# Patient Record
Sex: Female | Born: 1970 | Race: Black or African American | Hispanic: No | Smoking: Never smoker
Health system: Southern US, Community
[De-identification: ages and names within clinical notes are randomized; demographics above are authoritative.]

## PROBLEM LIST (undated history)

## (undated) DIAGNOSIS — B019 Varicella without complication: Secondary | ICD-10-CM

## (undated) DIAGNOSIS — G43909 Migraine, unspecified, not intractable, without status migrainosus: Secondary | ICD-10-CM

## (undated) DIAGNOSIS — R51 Headache: Secondary | ICD-10-CM

## (undated) DIAGNOSIS — IMO0002 Reserved for concepts with insufficient information to code with codable children: Secondary | ICD-10-CM

## (undated) DIAGNOSIS — K219 Gastro-esophageal reflux disease without esophagitis: Secondary | ICD-10-CM

## (undated) DIAGNOSIS — I10 Essential (primary) hypertension: Secondary | ICD-10-CM

## (undated) HISTORY — DX: Gastro-esophageal reflux disease without esophagitis: K21.9

## (undated) HISTORY — DX: Migraine, unspecified, not intractable, without status migrainosus: G43.909

## (undated) HISTORY — DX: Varicella without complication: B01.9

## (undated) HISTORY — DX: Reserved for concepts with insufficient information to code with codable children: IMO0002

## (undated) HISTORY — DX: Headache: R51

## (undated) HISTORY — DX: Essential (primary) hypertension: I10

---

## 1998-07-12 HISTORY — PX: WRIST SURGERY: SHX841

## 2005-05-06 ENCOUNTER — Emergency Department: Payer: Self-pay | Admitting: Internal Medicine

## 2005-05-08 ENCOUNTER — Emergency Department: Payer: Self-pay | Admitting: Emergency Medicine

## 2007-04-27 ENCOUNTER — Ambulatory Visit: Payer: Self-pay

## 2008-04-18 DIAGNOSIS — I10 Essential (primary) hypertension: Secondary | ICD-10-CM | POA: Insufficient documentation

## 2011-12-30 ENCOUNTER — Encounter: Payer: Self-pay | Admitting: Internal Medicine

## 2011-12-30 ENCOUNTER — Ambulatory Visit (INDEPENDENT_AMBULATORY_CARE_PROVIDER_SITE_OTHER): Payer: PRIVATE HEALTH INSURANCE | Admitting: Internal Medicine

## 2011-12-30 VITALS — BP 140/90 | HR 78 | Temp 98.9°F | Ht 68.0 in | Wt 180.0 lb

## 2011-12-30 DIAGNOSIS — M25562 Pain in left knee: Secondary | ICD-10-CM

## 2011-12-30 DIAGNOSIS — Z0000 Encounter for general adult medical examination without abnormal findings: Secondary | ICD-10-CM

## 2011-12-30 DIAGNOSIS — M25569 Pain in unspecified knee: Secondary | ICD-10-CM

## 2011-12-30 DIAGNOSIS — I10 Essential (primary) hypertension: Secondary | ICD-10-CM

## 2011-12-31 LAB — CBC WITH DIFFERENTIAL/PLATELET
Basophils Absolute: 0.1 10*3/uL (ref 0.0–0.1)
Eosinophils Absolute: 0 10*3/uL (ref 0.0–0.7)
Lymphocytes Relative: 26.9 % (ref 12.0–46.0)
MCHC: 32.9 g/dL (ref 30.0–36.0)
Neutrophils Relative %: 64 % (ref 43.0–77.0)
Platelets: 310 10*3/uL (ref 150.0–400.0)
RBC: 4.5 Mil/uL (ref 3.87–5.11)
RDW: 14.9 % — ABNORMAL HIGH (ref 11.5–14.6)

## 2011-12-31 LAB — COMPREHENSIVE METABOLIC PANEL
Albumin: 4.4 g/dL (ref 3.5–5.2)
Alkaline Phosphatase: 64 U/L (ref 39–117)
BUN: 12 mg/dL (ref 6–23)
Calcium: 9.4 mg/dL (ref 8.4–10.5)
GFR: 76.01 mL/min (ref >60.00)
Glucose, Bld: 77 mg/dL (ref 70–99)
Potassium: 3.3 mEq/L — ABNORMAL LOW (ref 3.5–5.1)

## 2011-12-31 LAB — LIPID PANEL
Cholesterol: 169 mg/dL (ref 0–200)
HDL: 55.1 mg/dL (ref >39.00)
Triglycerides: 65 mg/dL (ref 0.0–149.0)

## 2011-12-31 LAB — MICROALBUMIN / CREATININE URINE RATIO
Creatinine,U: 132.6 mg/dL
Microalb, Ur: 0.1 mg/dL (ref 0.0–1.9)

## 2011-12-31 NOTE — Assessment & Plan Note (Signed)
Blood pressure slightly elevated today. We'll continue to monitor. If blood pressure consistently greater than 140/90, will consider addition of medication. For now, continue Maxide. We'll check renal function with labs. Follow up 6 months.

## 2011-12-31 NOTE — Assessment & Plan Note (Signed)
Patient with left knee pain over the last couple weeks, now resolved. Exam is normal. We'll continue to monitor. If symptoms are recurrent, patient will call or e-mail.

## 2011-12-31 NOTE — Progress Notes (Signed)
Subjective:    Patient ID: Cindy Bishop, female    DOB: 06-24-1971, 41 y.o.   MRN: [id number]  HPI 41 year old female with history of hypertension presents to establish care. She generally reports she's been feeling well. Approximately 2 weeks ago, she developed some pain in her left knee. This is associated with swelling. There is no known trauma to her knee. The swelling and pain resolved without any intervention. She denies any weakness in her leg or instability in her knee. Aside from this, she is feeling well and has no complaints. In regards to her history of hypertension, she reports good compliance with her medication. She denies any headache, chest pain, palpitations.  Outpatient Encounter Prescriptions as of 12/30/2011  Medication Sig Dispense Refill  . triamterene-hydrochlorothiazide (MAXZIDE-25) 37.5-25 MG per tablet Take 1 tablet by mouth daily.        Review of Systems  Constitutional: Negative for fever, chills, appetite change, fatigue and unexpected weight change.  HENT: Negative for ear pain, congestion, sore throat, trouble swallowing, neck pain, voice change and sinus pressure.   Eyes: Negative for visual disturbance.  Respiratory: Negative for cough, shortness of breath, wheezing and stridor.   Cardiovascular: Negative for chest pain, palpitations and leg swelling.  Gastrointestinal: Negative for nausea, vomiting, abdominal pain, diarrhea, constipation, blood in stool, abdominal distention and anal bleeding.  Genitourinary: Negative for dysuria and flank pain.  Musculoskeletal: Negative for myalgias, arthralgias and gait problem.  Skin: Negative for color change and rash.  Neurological: Negative for dizziness and headaches.  Hematological: Negative for adenopathy. Does not bruise/bleed easily.  Psychiatric/Behavioral: Negative for suicidal ideas, disturbed wake/sleep cycle and dysphoric mood. The patient is not nervous/anxious.    BP 140/90  Pulse 78  Temp 98.9 F  (37.2 C) (Oral)  Ht 5\' 8"  (1.727 m)  Wt 180 lb (81.647 kg)  BMI 27.37 kg/m2  SpO2 98%  LMP 12/11/2011     Objective:   Physical Exam  Constitutional: She is oriented to person, place, and time. She appears well-developed and well-nourished. No distress.  HENT:  Head: Normocephalic and atraumatic.  Right Ear: External ear normal.  Left Ear: External ear normal.  Nose: Nose normal.  Mouth/Throat: Oropharynx is clear and moist. No oropharyngeal exudate.  Eyes: Conjunctivae are normal. Pupils are equal, round, and reactive to light. Right eye exhibits no discharge. Left eye exhibits no discharge. No scleral icterus.  Neck: Normal range of motion. Neck supple. No tracheal deviation present. No thyromegaly present.  Cardiovascular: Normal rate, regular rhythm, normal heart sounds and intact distal pulses.  Exam reveals no gallop and no friction rub.   No murmur heard. Pulmonary/Chest: Effort normal and breath sounds normal. No respiratory distress. She has no wheezes. She has no rales. She exhibits no tenderness.  Abdominal: Soft. Bowel sounds are normal. She exhibits no distension. There is no tenderness.  Musculoskeletal: Normal range of motion. She exhibits no edema and no tenderness.       Left knee: She exhibits normal range of motion, no swelling, no effusion, no deformity, normal alignment, no LCL laxity and normal patellar mobility.  Lymphadenopathy:    She has no cervical adenopathy.  Neurological: She is alert and oriented to person, place, and time. No cranial nerve deficit. She exhibits normal muscle tone. Coordination normal.  Skin: Skin is warm and dry. No rash noted. She is not diaphoretic. No erythema. No pallor.  Psychiatric: She has a normal mood and affect. Her behavior is normal. Judgment and thought content  normal.          Assessment & Plan:

## 2012-07-03 ENCOUNTER — Ambulatory Visit: Payer: PRIVATE HEALTH INSURANCE | Admitting: Internal Medicine

## 2012-08-26 ENCOUNTER — Other Ambulatory Visit: Payer: Self-pay

## 2013-05-17 ENCOUNTER — Other Ambulatory Visit: Payer: Self-pay

## 2015-10-17 ENCOUNTER — Other Ambulatory Visit: Payer: Self-pay | Admitting: Family Medicine

## 2015-10-23 ENCOUNTER — Other Ambulatory Visit: Payer: Self-pay | Admitting: Family Medicine

## 2016-03-29 ENCOUNTER — Other Ambulatory Visit: Payer: Self-pay | Admitting: Family Medicine

## 2016-03-29 DIAGNOSIS — Z1231 Encounter for screening mammogram for malignant neoplasm of breast: Secondary | ICD-10-CM

## 2016-03-30 ENCOUNTER — Ambulatory Visit
Admission: RE | Admit: 2016-03-30 | Discharge: 2016-03-30 | Disposition: A | Discharge status: Home / Self Care | Payer: PRIVATE HEALTH INSURANCE | Source: Ambulatory Visit | Attending: Family Medicine | Admitting: Family Medicine

## 2016-03-30 DIAGNOSIS — Z1231 Encounter for screening mammogram for malignant neoplasm of breast: Secondary | ICD-10-CM | POA: Insufficient documentation

## 2016-04-07 ENCOUNTER — Ambulatory Visit (INDEPENDENT_AMBULATORY_CARE_PROVIDER_SITE_OTHER): Payer: No Typology Code available for payment source | Admitting: Internal Medicine

## 2016-04-07 ENCOUNTER — Ambulatory Visit
Admission: RE | Admit: 2016-04-07 | Discharge: 2016-04-07 | Disposition: A | Discharge status: Home / Self Care | Payer: PRIVATE HEALTH INSURANCE | Source: Ambulatory Visit | Attending: Internal Medicine | Admitting: Internal Medicine

## 2016-04-07 ENCOUNTER — Encounter: Payer: Self-pay | Admitting: Internal Medicine

## 2016-04-07 VITALS — BP 122/82 | HR 62 | Temp 98.3°F | Resp 16 | Ht 67.0 in | Wt 193.0 lb

## 2016-04-07 DIAGNOSIS — N943 Premenstrual tension syndrome: Secondary | ICD-10-CM

## 2016-04-07 DIAGNOSIS — I10 Essential (primary) hypertension: Secondary | ICD-10-CM | POA: Diagnosis not present

## 2016-04-07 DIAGNOSIS — M5134 Other intervertebral disc degeneration, thoracic region: Secondary | ICD-10-CM | POA: Insufficient documentation

## 2016-04-07 DIAGNOSIS — M26623 Arthralgia of bilateral temporomandibular joint: Secondary | ICD-10-CM | POA: Insufficient documentation

## 2016-04-07 DIAGNOSIS — M4184 Other forms of scoliosis, thoracic region: Secondary | ICD-10-CM | POA: Insufficient documentation

## 2016-04-07 DIAGNOSIS — K648 Other hemorrhoids: Secondary | ICD-10-CM

## 2016-04-07 DIAGNOSIS — M549 Dorsalgia, unspecified: Secondary | ICD-10-CM

## 2016-04-07 DIAGNOSIS — G43829 Menstrual migraine, not intractable, without status migrainosus: Secondary | ICD-10-CM | POA: Diagnosis not present

## 2016-04-07 DIAGNOSIS — Z872 Personal history of diseases of the skin and subcutaneous tissue: Secondary | ICD-10-CM | POA: Diagnosis not present

## 2016-04-07 DIAGNOSIS — K644 Residual hemorrhoidal skin tags: Secondary | ICD-10-CM | POA: Insufficient documentation

## 2016-04-07 DIAGNOSIS — Z8739 Personal history of other diseases of the musculoskeletal system and connective tissue: Secondary | ICD-10-CM | POA: Insufficient documentation

## 2016-04-07 DIAGNOSIS — M62830 Muscle spasm of back: Secondary | ICD-10-CM | POA: Insufficient documentation

## 2016-04-07 DIAGNOSIS — M5442 Lumbago with sciatica, left side: Secondary | ICD-10-CM | POA: Diagnosis present

## 2016-04-07 DIAGNOSIS — M5136 Other intervertebral disc degeneration, lumbar region: Secondary | ICD-10-CM | POA: Insufficient documentation

## 2016-04-07 MED ORDER — MELOXICAM 15 MG PO TABS: 15.0000 mg | ORAL_TABLET | Freq: Every day | ORAL | 3 refills | Status: DC

## 2016-04-07 MED ORDER — METHOCARBAMOL 500 MG PO TABS: 500.0000 mg | ORAL_TABLET | Freq: Four times a day (QID) | ORAL | 0 refills | Status: DC

## 2016-04-07 NOTE — Progress Notes (Signed)
Date:  04/07/2016   Name:  Cindy Bishop   DOB:  23-Dec-1970   MRN:  [id number]   Chief Complaint: Establish Care and Back Pain (1 week of pain in left middle of back. Also has some pain in Left arm and leg and often in jaw. ) Back Pain  This is a recurrent problem. The current episode started more than 1 year ago. The problem has been gradually worsening since onset. The pain is present in the thoracic spine and lumbar spine. The quality of the pain is described as aching and cramping. The pain radiates to the left thigh. The pain is mild. The pain is worse during the day (especially when standing at work). Associated symptoms include headaches, numbness and tingling. Pertinent negatives include no abdominal pain, chest pain, dysuria, fever or weakness. Treatments tried: seen years ago at [facility] showed degenerative changes.  Hypertension  This is a chronic problem. The current episode started more than 1 year ago. The problem is unchanged. The problem is controlled. Associated symptoms include headaches. Pertinent negatives include no chest pain, palpitations or shortness of breath. Past treatments include ACE inhibitors and diuretics. The current treatment provides significant improvement. There are no compliance problems.   Headache   This is a recurrent problem. The problem occurs monthly (just prior to menstrual cycle). The problem has been unchanged. Associated symptoms include back pain, numbness and tingling. Pertinent negatives include no abdominal pain, coughing, dizziness, fever, sinus pressure or weakness. Her past medical history is significant for hypertension.  Rectal Bleeding   The current episode started more than 1 week ago. Onset quality: intermittently for several years - about every 2-3 months. The problem occurs occasionally. The patient is experiencing no pain. The stool is described as bloody. Associated symptoms include hemorrhoids and headaches. Pertinent negatives  include no fever, no abdominal pain, no diarrhea, no rectal pain, no chest pain and no coughing.  Jaw Pain - increasing over the past few weeks.  Tingling at times.  No pain with eating.  No swelling or trouble swallowing. Left wrist - tingling in left hand and forearm at times. No weakness.  She is left handed.  It bothers her the most in the evening when she is trying to go to sleep.  She has hx ganglion cyst removal on the right.    Review of Systems  Constitutional: Negative for chills, fatigue and fever.  HENT: Negative for sinus pressure and trouble swallowing.   Eyes: Negative for visual disturbance.  Respiratory: Negative for cough, chest tightness and shortness of breath.   Cardiovascular: Negative for chest pain, palpitations and leg swelling.  Gastrointestinal: Positive for anal bleeding, hematochezia and hemorrhoids. Negative for abdominal pain, constipation, diarrhea and rectal pain.  Genitourinary: Negative for dysuria and menstrual problem.  Musculoskeletal: Positive for back pain.  Neurological: Positive for tingling, numbness and headaches. Negative for dizziness, tremors, syncope and weakness.  Hematological: Negative for adenopathy.    Patient Active Problem List   Diagnosis Date Noted  . Hypertension 12/30/2011  . Left knee pain 12/30/2011  . Leg varices 03/06/2009  . Essential (primary) hypertension 04/18/2008    Prior to Admission medications   Medication Sig Start Date End Date Taking? Authorizing Provider  lisinopril-hydrochlorothiazide (PRINZIDE,ZESTORETIC) 10-12.5 MG tablet TAKE ONE TABLET BY MOUTH ONCE DAILY 10/17/15  Yes Jodell Cipro Chrismon, PA    No Known Allergies  Past Surgical History:  Procedure Laterality Date  . WRIST SURGERY  2000   [facility]    Social History  Substance Use Topics  . Smoking status: Never Smoker  . Smokeless tobacco: Never Used  . Alcohol use No     Medication list has been reviewed and updated.   Physical  Exam  Constitutional: She is oriented to person, place, and time. She appears well-developed. No distress.  HENT:  Head: Normocephalic and atraumatic.  Neck: Normal range of motion. Neck supple. No thyromegaly present.  Cardiovascular: Normal rate, regular rhythm and normal heart sounds.   Pulmonary/Chest: She is in respiratory distress.  Musculoskeletal: Normal range of motion.       Left wrist: Normal.       Cervical back: Normal.       Thoracic back: She exhibits tenderness and spasm.       Lumbar back: She exhibits bony tenderness. She exhibits no spasm.  Tinel's and PHalen's signs negative bilaterally   SLR negative bilaterally  Jaw opening with mild lateral excursion to right; no click, pain or swelling  Lymphadenopathy:    She has no cervical adenopathy.    She has no axillary adenopathy.  Neurological: She is alert and oriented to person, place, and time. She has normal strength and normal reflexes. No cranial nerve deficit or sensory deficit. Gait normal.  Skin: Skin is warm and dry. No rash noted.  Psychiatric: She has a normal mood and affect. Her speech is normal and behavior is normal. Thought content normal.  Nursing note and vitals reviewed.   BP 122/82   Pulse 62   Temp 98.3 F (36.8 C) (Oral)   Resp 16   Ht 5\' 7"  (1.702 m)   Wt 193 lb (87.5 kg)   LMP 03/29/2016   SpO2 99%   BMI 30.23 kg/m   Assessment and Plan: 1. Essential hypertension controlled  2. Midline low back pain with left-sided sciatica Begin mobic - DG Lumbar Spine Complete; Future - meloxicam (MOBIC) 15 MG tablet; Take 1 tablet (15 mg total) by mouth daily.  Dispense: 30 tablet; Refill: 3  3. Spasm of muscle, back Begin muscle relaxant and heat daily - methocarbamol (ROBAXIN) 500 MG tablet; Take 1 tablet (500 mg total) by mouth 4 (four) times daily.  Dispense: 120 tablet; Refill: 0  4. Mid back pain - DG Thoracic Spine W/Swimmers; Future  5. Bilateral temporomandibular joint  pain Avoid hard, chewy and sticky foods  6. History of ganglion cyst May be early cyst on left causing sx - monitor for worsening  7. Menstrual migraine without status migrainosus, not intractable Unchanged - may need to discuss further at next visit  8. Bleeding external hemorrhoids Continue Prep H PRN Follow up if persistent   Bari EdwardLaura Alexie Lanni, MD [facility]  04/07/2016

## 2016-04-07 NOTE — Patient Instructions (Signed)

## 2016-08-05 ENCOUNTER — Other Ambulatory Visit: Payer: Self-pay | Admitting: Family Medicine

## 2016-08-05 DIAGNOSIS — J069 Acute upper respiratory infection, unspecified: Secondary | ICD-10-CM

## 2016-08-05 MED ORDER — PROMETHAZINE-DM 6.25-15 MG/5ML PO SYRP: 5.0000 mL | ORAL_SOLUTION | Freq: Four times a day (QID) | ORAL | 0 refills | Status: DC | PRN

## 2016-08-12 ENCOUNTER — Encounter: Payer: Self-pay | Admitting: Family Medicine

## 2016-08-12 DIAGNOSIS — D509 Iron deficiency anemia, unspecified: Secondary | ICD-10-CM | POA: Insufficient documentation

## 2016-11-03 ENCOUNTER — Other Ambulatory Visit: Payer: Self-pay | Admitting: Family Medicine

## 2016-11-25 ENCOUNTER — Other Ambulatory Visit: Payer: Self-pay | Admitting: Family Medicine

## 2016-12-03 ENCOUNTER — Ambulatory Visit (INDEPENDENT_AMBULATORY_CARE_PROVIDER_SITE_OTHER): Payer: No Typology Code available for payment source | Admitting: Family Medicine

## 2016-12-03 ENCOUNTER — Encounter: Payer: Self-pay | Admitting: Family Medicine

## 2016-12-03 VITALS — BP 122/72 | HR 70 | Temp 98.1°F | Wt 202.8 lb

## 2016-12-03 DIAGNOSIS — I10 Essential (primary) hypertension: Secondary | ICD-10-CM | POA: Diagnosis not present

## 2016-12-03 DIAGNOSIS — K625 Hemorrhage of anus and rectum: Secondary | ICD-10-CM

## 2016-12-03 MED ORDER — HYDROCORTISONE ACETATE 25 MG RE SUPP: 25.0000 mg | Freq: Two times a day (BID) | RECTAL | 0 refills | Status: DC

## 2016-12-03 NOTE — Progress Notes (Signed)
Patient: Cindy SeveranceDarlene Diaz-Bandera Female    DOB: [birth year]   46 y.o.   MRN: [id number] Visit Date: 12/03/2016  Today's Provider: Dortha Kernennis Chrismon, PA   Chief Complaint  Patient presents with  . Rectal Bleeding   Subjective:    Rectal Bleeding   Episode onset: 2 weeks ago. The problem occurs occasionally. The patient is experiencing no pain. The stool is described as mixed with blood. There was no prior successful therapy. There was no prior unsuccessful therapy. Associated symptoms include diarrhea. Urine output has been normal. Her past medical history is significant for a recent illness (cold).    Patient Active Problem List   Diagnosis Date Noted  . Iron deficiency anemia 08/12/2016  . Midline low back pain with left-sided sciatica 04/07/2016  . Spasm of muscle, back 04/07/2016  . Mid back pain 04/07/2016  . Bilateral temporomandibular joint pain 04/07/2016  . History of ganglion cyst 04/07/2016  . Menstrual migraine without status migrainosus, not intractable 04/07/2016  . Bleeding external hemorrhoids 04/07/2016  . Essential hypertension 12/30/2011  . Left knee pain 12/30/2011  . Leg varices 03/06/2009   Past Surgical History:  Procedure Laterality Date  . WRIST SURGERY  2000   [facility]   Family History  Problem Relation Age of Onset  . Diabetes Mother   . Hypertension Mother   . Hypertension Brother   . Heart disease Maternal Grandmother 1969  . Healthy Father   . Breast cancer Neg Hx    No Known Allergies   Previous Medications   LISINOPRIL-HYDROCHLOROTHIAZIDE (PRINZIDE,ZESTORETIC) 10-12.5 MG TABLET    TAKE ONE TABLET BY MOUTH ONCE DAILY    Review of Systems  Constitutional: Negative.   Respiratory: Negative.   Cardiovascular: Negative.   Gastrointestinal: Positive for diarrhea and hematochezia.    Social History  Substance Use Topics  . Smoking status: Never Smoker  . Smokeless tobacco: Never Used  . Alcohol use No   Objective:   BP 122/72  (BP Location: Right Arm, Patient Position: Sitting, Cuff Size: Normal)   Pulse 70   Temp 98.1 F (36.7 C) (Oral)   Wt 202 lb 12.8 oz (92 kg)   SpO2 98%   BMI 31.76 kg/m   Physical Exam  Constitutional: She is oriented to person, place, and time. She appears well-developed and well-nourished. No distress.  HENT:  Head: Normocephalic and atraumatic.  Right Ear: Hearing normal.  Left Ear: Hearing normal.  Nose: Nose normal.  Eyes: Conjunctivae and lids are normal. Right eye exhibits no discharge. Left eye exhibits no discharge. No scleral icterus.  Cardiovascular: Normal rate and regular rhythm.   Pulmonary/Chest: Effort normal and breath sounds normal. No respiratory distress.  Genitourinary:  Genitourinary Comments: Under anoscopy red irritation to internal hemorrhoids seen. No active bleeding at the present. No pain or sign of fissure.   Musculoskeletal: Normal range of motion.  Neurological: She is alert and oriented to person, place, and time.  Skin: Skin is intact. No lesion and no rash noted.  Psychiatric: She has a normal mood and affect. Her speech is normal and behavior is normal. Thought content normal.      Assessment & Plan:     1. Bright red rectal bleeding Intermittent bright red blood from rectum over the past 2 weeks. No pain, diarrhea or constipation. History of iron deficiency anemia. Under anoscope exam, some internal hemorrhoid irritation seen. Patient very worried about possible colon cancer and requests referral for colonoscopy. Will treat with Anusol HC  suppositories BID. - Ambulatory referral to Gastroenterology  2. Essential hypertension Well controlled on Zestoretic 10/12.5 mg qd. No side effects of dizziness, fatigue, cramps, palpitations, angioedema or dyspnea. Continue present regimen and recheck at [facility] regularly. Will get labs there in October.

## 2017-01-18 ENCOUNTER — Encounter: Payer: Self-pay | Admitting: Gastroenterology

## 2017-01-18 ENCOUNTER — Ambulatory Visit (INDEPENDENT_AMBULATORY_CARE_PROVIDER_SITE_OTHER): Payer: No Typology Code available for payment source | Admitting: Gastroenterology

## 2017-01-18 ENCOUNTER — Other Ambulatory Visit
Admission: RE | Admit: 2017-01-18 | Discharge: 2017-01-18 | Disposition: A | Discharge status: Home / Self Care | Payer: PRIVATE HEALTH INSURANCE | Source: Ambulatory Visit | Attending: Gastroenterology | Admitting: Gastroenterology

## 2017-01-18 ENCOUNTER — Other Ambulatory Visit: Payer: Self-pay

## 2017-01-18 ENCOUNTER — Telehealth: Payer: Self-pay

## 2017-01-18 VITALS — BP 124/72 | HR 64 | Temp 98.4°F | Ht 67.0 in | Wt 203.2 lb

## 2017-01-18 DIAGNOSIS — K625 Hemorrhage of anus and rectum: Secondary | ICD-10-CM | POA: Diagnosis not present

## 2017-01-18 DIAGNOSIS — Z800 Family history of malignant neoplasm of digestive organs: Secondary | ICD-10-CM

## 2017-01-18 LAB — CBC WITH DIFFERENTIAL/PLATELET
BASOS ABS: 0 10*3/uL (ref 0–0.1)
Basophils Relative: 0 %
Eosinophils Absolute: 0 10*3/uL (ref 0–0.7)
Eosinophils Relative: 0 %
HEMATOCRIT: 37.2 % (ref 35.0–47.0)
Hemoglobin: 12.3 g/dL (ref 12.0–16.0)
LYMPHS PCT: 27 %
Lymphs Abs: 1.6 10*3/uL (ref 1.0–3.6)
MCH: 29.3 pg (ref 26.0–34.0)
MCHC: 32.9 g/dL (ref 32.0–36.0)
MCV: 88.9 fL (ref 80.0–100.0)
MONO ABS: 0.5 10*3/uL (ref 0.2–0.9)
MONOS PCT: 9 %
NEUTROS ABS: 3.8 10*3/uL (ref 1.4–6.5)
Neutrophils Relative %: 64 %
Platelets: 252 10*3/uL (ref 150–440)
RBC: 4.19 MIL/uL (ref 3.80–5.20)
RDW: 14.2 % (ref 11.5–14.5)
WBC: 6 10*3/uL (ref 3.6–11.0)

## 2017-01-18 MED ORDER — NA SULFATE-K SULFATE-MG SULF 17.5-3.13-1.6 GM/177ML PO SOLN: 1.0000 | Freq: Once | ORAL | 0 refills | Status: AC

## 2017-01-18 NOTE — Progress Notes (Signed)
Cindy MoodKiran Arohi Salvatierra MD, MRCP(U.K) [address]  [address]  [place], [place] [address]  Main: [phone]  Fax: [phone]   Gastroenterology Consultation  Referring Provider:     [name], Cindy E, PA Primary Care Physician:  Cindy Bishop, Cindy H, MD Primary Gastroenterologist:  Dr. Wyline MoodKiran Sua Bishop  Reason for Consultation:     Rectal bleeding         HPI:   Cindy Bishop is a 46 y.o. y/o female referred for consultation & management  by Dr. Judithann Bishop, Cindy CowdenLaura H, MD.     Rectal bleeding :  Onset and where was blood seen  :on and off for 3 years, 11 days back was more intense and lasted for 11 days, sees blood mixed in the stools. No pain with  abowel movement  Frequency of bowel movements :4-5 times a week  Consistency : not hard  Change in shape of stool:no  Pain associated with bowel movements:no  Blood thinner usage:no  NSAID's: no  Prior colonoscopy :no  Family history of colon cancer or polyps:no  Weight loss:no     Past Medical History:  Diagnosis Date  . Chicken pox   . Degenerative disc disease   . GERD (gastroesophageal reflux disease)   . Headache(784.0)   . Hypertension   . Migraine     Past Surgical History:  Procedure Laterality Date  . WRIST SURGERY  2000   [facility]    Prior to Admission medications   Medication Sig Start Date End Date Taking? Authorizing Provider  lisinopril-hydrochlorothiazide (PRINZIDE,ZESTORETIC) 10-12.5 MG tablet TAKE ONE TABLET BY MOUTH ONCE DAILY 11/04/16  Yes Chrismon, Jodell Ciproennis E, PA  hydrocortisone (ANUSOL-HC) 25 MG suppository Place 1 suppository (25 mg total) rectally 2 (two) times daily. Patient not taking: Reported on 01/18/2017 12/03/16   Chrismon, Jodell Ciproennis E, PA    Family History  Problem Relation Age of Onset  . Diabetes Mother   . Hypertension Mother   . Hypertension Brother   . Heart disease Maternal Grandmother [id number]  . Healthy Father   . Breast cancer Neg Hx      Social History  Substance Use  Topics  . Smoking status: Never Smoker  . Smokeless tobacco: Never Used  . Alcohol use No    Allergies as of 01/18/2017  . (No Known Allergies)    Review of Systems:    All systems reviewed and negative except where noted in HPI.   Physical Exam:  BP 124/72   Pulse 64   Temp 98.4 F (36.9 C) (Oral)   Ht 5\' 7"  (1.702 m)   Wt 203 lb 3.2 oz (92.2 kg)   BMI 31.83 kg/m  No LMP recorded. Psych:  Alert and cooperative. Normal mood and affect. General:   Alert,  Well-developed, well-nourished, pleasant and cooperative in NAD Head:  Normocephalic and atraumatic. Eyes:  Sclera clear, no icterus.   Conjunctiva pink. Ears:  Normal auditory acuity. Nose:  No deformity, discharge, or lesions. Mouth:  No deformity or lesions,oropharynx pink & moist. Neck:  Supple; no masses or thyromegaly. Lungs:  Respirations even and unlabored.  Clear throughout to auscultation.   No wheezes, crackles, or rhonchi. No acute distress. Heart:  Regular rate and rhythm; no murmurs, clicks, rubs, or gallops. Abdomen:  Normal bowel sounds.  No bruits.  Soft, non-tender and non-distended without masses, hepatosplenomegaly or hernias noted.  No guarding or rebound tenderness.    Msk:  Symmetrical without gross deformities. Good, equal movement & strength bilaterally. Pulses:  Normal pulses noted. Extremities:  No clubbing or edema.  No cyanosis. Neurologic:  Alert and oriented x3;  grossly normal neurologically. Skin:  Intact without significant lesions or rashes. No jaundice. Lymph Nodes:  No significant cervical adenopathy. Psych:  Alert and cooperative. Normal mood and affect.  Imaging Studies: No results found.  Assessment and Plan:   Cindy Bishop is a 46 y.o. y/o female has been referred for rectal bleeding . Painless. Will proceed with a diagnostic colonoscopy and obtaina CBC  I have discussed alternative options, risks & benefits,  which include, but are not limited to, bleeding, infection,  perforation,respiratory complication & drug reaction.  The patient agrees with this plan & written consent will be obtained.     Follow up PRN  Dr Cindy Mood MD,MRCP(U.K)

## 2017-01-18 NOTE — Telephone Encounter (Signed)
Gastroenterology Pre-Procedure Review  Request Date: 7/31 Requesting Physician: Dr. Tobi BastosAnna  PATIENT REVIEW QUESTIONS: The patient responded to the following health history questions as indicated:    1. Are you having any GI issues? no 2. Do you have a personal history of Polyps? no 3. Do you have a family history of Colon Cancer or Polyps? no 4. Diabetes Mellitus? no 5. Joint replacements in the past 12 months?no 6. Major health problems in the past 3 months?no 7. Any artificial heart valves, MVP, or defibrillator?no    MEDICATIONS & ALLERGIES:    Patient reports the following regarding taking any anticoagulation/antiplatelet therapy:   Plavix, Coumadin, Eliquis, Xarelto, Lovenox, Pradaxa, Brilinta, or Effient? no Aspirin? no  Patient confirms/reports the following medications:  Current Outpatient Prescriptions  Medication Sig Dispense Refill  . hydrocortisone (ANUSOL-HC) 25 MG suppository Place 1 suppository (25 mg total) rectally 2 (two) times daily. (Patient not taking: Reported on 01/18/2017) 12 suppository 0  . lisinopril-hydrochlorothiazide (PRINZIDE,ZESTORETIC) 10-12.5 MG tablet TAKE ONE TABLET BY MOUTH ONCE DAILY 30 tablet 11   No current facility-administered medications for this visit.     Patient confirms/reports the following allergies:  No Known Allergies  No orders of the defined types were placed in this encounter.   AUTHORIZATION INFORMATION Primary Insurance: 1D#: Group #:  Secondary Insurance: 1D#: Group #:  SCHEDULE INFORMATION: Date: 7/31 Time: Location: [facility]

## 2017-01-22 ENCOUNTER — Emergency Department
Admission: EM | Admit: 2017-01-22 | Discharge: 2017-01-22 | Disposition: A | Discharge status: Home / Self Care | Payer: No Typology Code available for payment source | Attending: Emergency Medicine | Admitting: Emergency Medicine

## 2017-01-22 ENCOUNTER — Encounter: Payer: Self-pay | Admitting: Emergency Medicine

## 2017-01-22 DIAGNOSIS — M545 Low back pain: Secondary | ICD-10-CM | POA: Diagnosis present

## 2017-01-22 DIAGNOSIS — Z79899 Other long term (current) drug therapy: Secondary | ICD-10-CM | POA: Insufficient documentation

## 2017-01-22 DIAGNOSIS — M5442 Lumbago with sciatica, left side: Secondary | ICD-10-CM | POA: Diagnosis not present

## 2017-01-22 DIAGNOSIS — I10 Essential (primary) hypertension: Secondary | ICD-10-CM | POA: Insufficient documentation

## 2017-01-22 MED ORDER — ORPHENADRINE CITRATE 30 MG/ML IJ SOLN
60.0000 mg | Freq: Two times a day (BID) | INTRAMUSCULAR | Status: DC
  Administered 2017-01-22: 60 mg via INTRAMUSCULAR
  Filled 2017-01-22: qty 2

## 2017-01-22 MED ORDER — PREDNISONE 50 MG PO TABS: ORAL_TABLET | ORAL | 0 refills | Status: DC

## 2017-01-22 MED ORDER — METHYLPREDNISOLONE SODIUM SUCC 125 MG IJ SOLR
125.0000 mg | Freq: Once | INTRAMUSCULAR | Status: AC
  Administered 2017-01-22: 125 mg via INTRAMUSCULAR
  Filled 2017-01-22: qty 2

## 2017-01-22 NOTE — ED Triage Notes (Signed)
Pt c/o left lower back pain X 1 month.  Got worse yesterday and now radiates into left leg.  Pain worse with certain movements. No loss bowel or bladder. Ambulatory to triage without difficulty.

## 2017-01-22 NOTE — ED Notes (Signed)
See triage note  States she developed lower back pain about 1 month ago  States pain is now radiating into left leg  No injury or bowel or bladder problems

## 2017-01-22 NOTE — ED Provider Notes (Signed)
[facility] Emergency Department Provider Note  ____________________________________________  Time seen: Approximately 11:47 PM  I have reviewed the triage vital signs and the nursing notes.   HISTORY  Chief Complaint Back Pain    HPI Cindy Bishop is a 46 y.o. female presenting to the emergency department with 10 out of 10 bilateral low back pain with radiculopathy of the left leg. Patient states that she has experienced similar symptoms in the past. She denies weakness, changes in sensation of the lower extremities or bowel or bladder incontinence. She denies trauma or heavy lifting. No falls. No alleviating measures have been attempted.   Past Medical History:  Diagnosis Date  . Chicken pox   . Degenerative disc disease   . GERD (gastroesophageal reflux disease)   . Headache(784.0)   . Hypertension   . Migraine     Patient Active Problem List   Diagnosis Date Noted  . Iron deficiency anemia 08/12/2016  . Midline low back pain with left-sided sciatica 04/07/2016  . Spasm of muscle, back 04/07/2016  . Mid back pain 04/07/2016  . Bilateral temporomandibular joint pain 04/07/2016  . History of ganglion cyst 04/07/2016  . Menstrual migraine without status migrainosus, not intractable 04/07/2016  . Bleeding external hemorrhoids 04/07/2016  . Essential hypertension 12/30/2011  . Left knee pain 12/30/2011  . Leg varices 03/06/2009    Past Surgical History:  Procedure Laterality Date  . WRIST SURGERY  2000   [facility] wrist, [facility]    Prior to Admission medications   Medication Sig Start Date End Date Taking? Authorizing Provider  hydrocortisone (ANUSOL-HC) 25 MG suppository Place 1 suppository (25 mg total) rectally 2 (two) times daily. Patient not taking: Reported on 01/18/2017 12/03/16   Chrismon, Jodell Ciproennis E, PA  lisinopril-hydrochlorothiazide (PRINZIDE,ZESTORETIC) 10-12.5 MG tablet TAKE ONE TABLET BY MOUTH ONCE DAILY 11/04/16   Chrismon,  Jodell Ciproennis E, PA  predniSONE (DELTASONE) 50 MG tablet Take one tablet by mouth daily for the next five days. 01/22/17   Orvil FeilWoods, Jaclyn M, PA-C    Allergies Patient has no known allergies.  Family History  Problem Relation Age of Onset  . Diabetes Mother   . Hypertension Mother   . Hypertension Brother   . Heart disease Maternal Grandmother 7769  . Healthy Father   . Lung cancer Father   . Breast cancer Cousin     Social History Social History  Substance Use Topics  . Smoking status: Never Smoker  . Smokeless tobacco: Never Used  . Alcohol use No    Review of Systems  Constitutional: No fever/chills Eyes: No visual changes. No discharge ENT: No upper respiratory complaints. Cardiovascular: no chest pain. Respiratory: no cough. No SOB. Gastrointestinal: No abdominal pain.  No nausea, no vomiting.  No diarrhea.  No constipation. Genitourinary: Negative for dysuria. No hematuria Musculoskeletal: Patient has low back pain with left lower extremity radiculopathy. Skin: Negative for rash, abrasions, lacerations, ecchymosis. Neurological: Negative for headaches, focal weakness or numbness. ____________________________________________   PHYSICAL EXAM:  VITAL SIGNS: ED Triage Vitals [01/22/17 1543]  Enc Vitals Group     BP (!) 155/79     Pulse Rate 79     Resp 16     Temp 97.8 F (36.6 C)     Temp Source Oral     SpO2 100 %     Weight 204 lb (92.5 kg)     Height 5\' 7"  (1.702 m)     Head Circumference      Peak Flow  Pain Score 10     Pain Loc      Pain Edu?      Excl. in GC?      Constitutional: Alert and oriented. Patient is talkative and engaged.  Eyes: Palpebral and bulbar conjunctiva are nonerythematous bilaterally. PERRL. EOMI.  Head: Atraumatic. Neck: Full range of motion. No pain with neck flexion. No pain with palpation of the cervical spine.  Cardiovascular: No pain with palpation over the anterior and posterior chest wall. Normal rate, regular rhythm.  Normal S1 and S2. No murmurs, gallops or rubs auscultated.  Respiratory: Trachea is midline. Resonant and symmetric percussion tones bilaterally. On auscultation, adventitious sounds are absent.  Musculoskeletal: Patient has 5/5 strength in the upper and lower extremities bilaterally. Full range of motion at the shoulder, elbow and wrist bilaterally. Full range of motion at the hip, knee and ankle bilaterally. Patient has paraspinal muscle tenderness along the lumbar spine. Positive straight leg raise test, left. No changes in gait. Palpable radial, ulnar and dorsalis pedis pulses bilaterally and symmetrically. Neurologic: Normal speech and language. No gross focal neurologic deficits are appreciated. Cranial nerves: 2-10 normal as tested. Cerebellar: Finger-nose-finger WNL, heel to shin WNL. Sensorimotor: No sensory loss or abnormal reflexes. Vision: No visual field deficts noted to confrontation.  Speech: No dysarthria or expressive aphasia.  Skin:  Skin is warm, dry and intact. No rash or bruising noted.  Psychiatric: Mood and affect are normal for age. Speech and behavior are normal.    ____________________________________________   LABS (all labs ordered are listed, but only abnormal results are displayed)  Labs Reviewed - No data to display ____________________________________________  EKG   ____________________________________________  RADIOLOGY   No results found.  ____________________________________________    PROCEDURES  Procedure(s) performed:    Procedures    Medications  methylPREDNISolone sodium succinate (SOLU-MEDROL) 125 mg/2 mL injection 125 mg (125 mg Intramuscular Given 01/22/17 1811)     ____________________________________________   INITIAL IMPRESSION / ASSESSMENT AND PLAN / ED COURSE  Pertinent labs & imaging results that were available during my care of the patient were reviewed by me and considered in my medical decision making (see chart  for details).  Review of the  [place]RS was performed in accordance of the [place] prior to dispensing any controlled drugs.     Assessment and plan: Acute left-sided low back pain with left-sided sciatica Patient presents to the emergency department with low back pain with left lower extremity radiculopathy for the past month. Neurologic exam and overall physical exam is reassuring. Patient was given injections of Solu-Medrol and Norflex in the emergency department. She was discharged with prednisone. Patient was advised to follow-up with primary care as needed. All patient questions were answered. ____________________________________________  FINAL CLINICAL IMPRESSION(S) / ED DIAGNOSES  Final diagnoses:  Acute left-sided low back pain with left-sided sciatica      NEW MEDICATIONS STARTED DURING THIS VISIT:  Discharge Medication List as of 01/22/2017  6:05 PM          This chart was dictated using voice recognition software/Dragon. Despite best efforts to proofread, errors can occur which can change the meaning. Any change was purely unintentional.    Orvil Feil, PA-C 01/22/17 2353    Rockne Menghini, MD 01/25/17 [phone]

## 2017-01-23 ENCOUNTER — Encounter: Payer: Self-pay | Admitting: Gastroenterology

## 2017-01-23 NOTE — Progress Notes (Signed)
Normal labs.

## 2017-01-24 ENCOUNTER — Telehealth: Payer: Self-pay

## 2017-01-24 NOTE — Telephone Encounter (Signed)
Advised patient of results per Dr. Tobi BastosAnna.  Normal labs

## 2017-01-24 NOTE — Telephone Encounter (Signed)
-----   Message from Wyline MoodKiran Anna, MD sent at 01/23/2017  6:39 PM EDT ----- Normal labs

## 2017-02-04 ENCOUNTER — Telehealth: Payer: Self-pay | Admitting: Gastroenterology

## 2017-02-04 ENCOUNTER — Other Ambulatory Visit: Payer: Self-pay

## 2017-02-04 DIAGNOSIS — K625 Hemorrhage of anus and rectum: Secondary | ICD-10-CM

## 2017-02-04 MED ORDER — PEG 3350-KCL-NA BICARB-NACL 420 G PO SOLR: 4000.0000 mL | Freq: Once | ORAL | 0 refills | Status: AC

## 2017-02-04 NOTE — Telephone Encounter (Signed)
Patient HAS NOT received her info packet for her procedure. Will you please email it to her today. Her pharmacy is [facility] on [address]

## 2017-02-04 NOTE — Telephone Encounter (Signed)
Patient called to state she did not receive packet in the mail.  Emailed packet to patient.   Sent prep Rx to pharmacy.

## 2017-02-08 ENCOUNTER — Ambulatory Visit: Payer: No Typology Code available for payment source | Admitting: Anesthesiology

## 2017-02-08 ENCOUNTER — Encounter
Admission: RE | Disposition: A | Discharge status: Home / Self Care | Payer: Self-pay | Source: Ambulatory Visit | Attending: Gastroenterology

## 2017-02-08 ENCOUNTER — Ambulatory Visit
Admission: RE | Admit: 2017-02-08 | Discharge: 2017-02-08 | Disposition: A | Discharge status: Home / Self Care | Payer: No Typology Code available for payment source | Source: Ambulatory Visit | Attending: Gastroenterology | Admitting: Gastroenterology

## 2017-02-08 ENCOUNTER — Telehealth: Payer: Self-pay

## 2017-02-08 ENCOUNTER — Other Ambulatory Visit: Payer: Self-pay

## 2017-02-08 DIAGNOSIS — I10 Essential (primary) hypertension: Secondary | ICD-10-CM | POA: Diagnosis not present

## 2017-02-08 DIAGNOSIS — K219 Gastro-esophageal reflux disease without esophagitis: Secondary | ICD-10-CM | POA: Insufficient documentation

## 2017-02-08 DIAGNOSIS — Z79899 Other long term (current) drug therapy: Secondary | ICD-10-CM | POA: Insufficient documentation

## 2017-02-08 DIAGNOSIS — K625 Hemorrhage of anus and rectum: Secondary | ICD-10-CM | POA: Diagnosis not present

## 2017-02-08 DIAGNOSIS — Z800 Family history of malignant neoplasm of digestive organs: Secondary | ICD-10-CM

## 2017-02-08 HISTORY — PX: COLONOSCOPY WITH PROPOFOL: SHX5780

## 2017-02-08 LAB — POCT PREGNANCY, URINE: Preg Test, Ur: NEGATIVE

## 2017-02-08 SURGERY — COLONOSCOPY WITH PROPOFOL
Anesthesia: General

## 2017-02-08 MED ORDER — PROPOFOL 500 MG/50ML IV EMUL
INTRAVENOUS | Status: AC
  Filled 2017-02-08: qty 50

## 2017-02-08 MED ORDER — SODIUM CHLORIDE 0.9 % IV SOLN
INTRAVENOUS | Status: DC | PRN
  Administered 2017-02-08: 09:00:00 via INTRAVENOUS

## 2017-02-08 MED ORDER — PROPOFOL 500 MG/50ML IV EMUL
INTRAVENOUS | Status: DC | PRN
  Administered 2017-02-08: 125 ug/kg/min via INTRAVENOUS

## 2017-02-08 MED ORDER — PROPOFOL 10 MG/ML IV BOLUS
INTRAVENOUS | Status: DC | PRN
  Administered 2017-02-08: 60 mg via INTRAVENOUS

## 2017-02-08 MED ORDER — LIDOCAINE HCL (PF) 2 % IJ SOLN
INTRAMUSCULAR | Status: DC | PRN
  Administered 2017-02-08: 50 mg via INTRADERMAL

## 2017-02-08 MED ORDER — SODIUM CHLORIDE 0.9 % IV SOLN
INTRAVENOUS | Status: DC
  Administered 2017-02-08: 09:00:00 via INTRAVENOUS

## 2017-02-08 NOTE — Op Note (Addendum)
[facility] Gastroenterology Patient Name: Cindy Bishop Procedure Date: 02/08/2017 8:20 AM MRN: [id number] Account #: [id number] Date of Birth: February 10, 1971 Admit Type: Ambulatory Age: [age] [facility] END[facility] ROOM 1 Gender: Female Note Status: Finalized THIS EXAM WAS SENT IN ERROR

## 2017-02-08 NOTE — Telephone Encounter (Signed)
Patient called to schedule colonoscopy.

## 2017-02-08 NOTE — Anesthesia Postprocedure Evaluation (Signed)
Anesthesia Post Note  Patient: Cindy Bishop  Procedure(s) Performed: Procedure(s) (LRB): COLONOSCOPY WITH PROPOFOL (N/A)  Patient location during evaluation: Endoscopy Anesthesia Type: General Level of consciousness: awake and alert and oriented Pain management: pain level controlled Vital Signs Assessment: post-procedure vital signs reviewed and stable Respiratory status: spontaneous breathing, nonlabored ventilation and respiratory function stable Cardiovascular status: blood pressure returned to baseline and stable Postop Assessment: no signs of nausea or vomiting Anesthetic complications: no     Last Vitals:  Vitals:   02/08/17 0922 02/08/17 0932  BP: 120/60 119/67  Pulse: 65 69  Resp: 18 17  Temp:      Last Pain:  Vitals:   02/08/17 0902  TempSrc: Tympanic  PainSc:                  Dustina Scoggin

## 2017-02-08 NOTE — H&P (Signed)
  Wyline MoodKiran Jibril Mcminn MD [address] [place], [place] [address] Phone: [phone] Fax : [phone]  Primary Care Physician:  Patient, No Pcp Per Primary Gastroenterologist:  Dr. Wyline MoodKiran Claris Pech   Pre-Procedure History & Physical: HPI:  Cindy Bishop is a 46 y.o. female is here for an colonoscopy.   Past Medical History:  Diagnosis Date  . Chicken pox   . Degenerative disc disease   . GERD (gastroesophageal reflux disease)   . Headache(784.0)   . Hypertension   . Migraine     Past Surgical History:  Procedure Laterality Date  . WRIST SURGERY  2000   [facility] wrist[facility]    Prior to Admission medications   Medication Sig Start Date End Date Taking? Authorizing Provider  lisinopril-hydrochlorothiazide (PRINZIDE,ZESTORETIC) 10-12.5 MG tablet TAKE ONE TABLET BY MOUTH ONCE DAILY 11/04/16  Yes Chrismon, Jodell Ciproennis E, PA  hydrocortisone (ANUSOL-HC) 25 MG suppository Place 1 suppository (25 mg total) rectally 2 (two) times daily. Patient not taking: Reported on 01/18/2017 12/03/16   Chrismon, Jodell Ciproennis E, PA  predniSONE (DELTASONE) 50 MG tablet Take one tablet by mouth daily for the next five days. Patient not taking: Reported on 02/08/2017 01/22/17   Orvil FeilWoods, Jaclyn M, PA-C    Allergies as of 01/18/2017  . (No Known Allergies)    Family History  Problem Relation Age of Onset  . Diabetes Mother   . Hypertension Mother   . Hypertension Brother   . Heart disease Maternal Grandmother 1869  . Healthy Father   . Lung cancer Father   . Breast cancer Cousin     Social History   Social History  . Marital status: Divorced    Spouse name: N/A  . Number of children: N/A  . Years of education: N/A   Occupational History  . Not on file.   Social History Main Topics  . Smoking status: Never Smoker  . Smokeless tobacco: Never Used  . Alcohol use Yes     Comment: socially  . Drug use: Yes    Types: Marijuana     Comment: yearly  . Sexual activity: Not on file   Other Topics  Concern  . Not on file   Social History Narrative   [place] No pets      Work - Doctor, [employer]   Diet - regular   Exercise - none    Review of Systems: See HPI, otherwise negative ROS  Physical Exam: BP (!) 154/68   Pulse 77   Temp 98.7 F (37.1 C) (Tympanic)   Resp 18   Ht 5\' 7"  (1.702 m)   Wt 204 lb (92.5 kg)   LMP 01/01/2017   SpO2 100%   BMI 31.95 kg/m  General:   Alert,  pleasant and cooperative in NAD Head:  Normocephalic and atraumatic. Neck:  Supple; no masses or thyromegaly. Lungs:  Clear throughout to auscultation.    Heart:  Regular rate and rhythm. Abdomen:  Soft, nontender and nondistended. Normal bowel sounds, without guarding, and without rebound.   Neurologic:  Alert and  oriented x4;  grossly normal neurologically.  Impression/Plan: Cindy Bishop is here for an colonoscopy to be performed for rectal bleeding   Risks, benefits, limitations, and alternatives regarding  colonoscopy have been reviewed with the patient.  Questions have been answered.  All parties agreeable.   Wyline MoodKiran Takeia Ciaravino, MD  02/08/2017, 8:42 AM

## 2017-02-08 NOTE — Op Note (Addendum)
[facility] Gastroenterology Patient Name: Cindy SeveranceDarlene Bishop Procedure Date: 02/08/2017 8:20 AM MRN: [id number] Account #: [id number] Date of Birth: [birth year] Admit Type: Ambulatory Age: [age] [facility] Gender: Female Note Status: Finalized Procedure:            Colonoscopy Indications:          Abdominal pain Providers:            Wyline MoodKiran Danah Reinecke MD, MD Medicines:            Monitored Anesthesia Care Complications:        No immediate complications. Procedure:            Pre-Anesthesia Assessment:                       - Prior to the procedure, a History and Physical was                        performed, and patient medications, allergies and                        sensitivities were reviewed. The patient's tolerance of                        previous anesthesia was reviewed.                       - The risks and benefits of the procedure and the                        sedation options and risks were discussed with the                        patient. All questions were answered and informed                        consent was obtained.                       - ASA Grade Assessment: III - A patient with severe                        systemic disease.                       After obtaining informed consent, the colonoscope was                        passed under direct vision. Throughout the procedure,                        the patient's blood pressure, pulse, and oxygen                        saturations were monitored continuously. The                        Colonoscope was introduced through the and advanced to                        the the cecum, identified by the appendiceal orifice,  IC valve and transillumination. The colonoscopy was                        performed with ease. The patient tolerated the                        procedure well. The quality of the bowel preparation                        was good. Findings:  The perianal and digital rectal examinations were normal.      The entire examined colon appeared normal. Impression:           - The entire examined colon is normal.                       - No specimens collected. Recommendation:       - Discharge patient to home (with escort).                       - Advance diet as tolerated.                       - Continue present medications.                       - Return to GI office PRN. Procedure Code(s):    --- Professional ---                       [phone], Colonoscopy, flexible; diagnostic, including                        collection of specimen(s) by brushing or washing, when                        performed (separate procedure) Diagnosis Code(s):    --- Professional ---                       R10.9, Unspecified abdominal pain CPT copyright 2016 American Medical Association. All rights reserved. The codes documented in this report are preliminary and upon coder review may  be revised to meet current compliance requirements. Wyline MoodKiran Kila Godina, MD Wyline MoodKiran Jaretzy Lhommedieu MD, MD 02/08/2017 8:38:11 AM This report has been signed electronically. Number of Addenda: 0 Note Initiated On: 02/08/2017 8:20 AM Scope Withdrawal Time: 0 hours 9 minutes 38 seconds  Total Procedure Duration: 0 hours 13 minutes 13 seconds       [facility]

## 2017-02-08 NOTE — Telephone Encounter (Signed)
Pt called office to schedule repeat colonoscopy due to poor prep.  She has been scheduled 03/29/17 [facility] and will send instructions for 2 day prep.

## 2017-02-08 NOTE — Anesthesia Preprocedure Evaluation (Signed)
Anesthesia Evaluation  Patient identified by MRN, date of birth, ID band Patient awake    Reviewed: Allergy & Precautions, NPO status , Patient's Chart, lab work & pertinent test results  History of Anesthesia Complications Negative for: history of anesthetic complications  Airway Mallampati: II  TM Distance: >3 FB Neck ROM: Full    Dental no notable dental hx.    Pulmonary neg pulmonary ROS, neg sleep apnea, neg COPD,    breath sounds clear to auscultation- rhonchi (-) wheezing      Cardiovascular Exercise Tolerance: Good hypertension, Pt. on medications (-) CAD, (-) Past MI, (-) Cardiac Stents and (-) CABG  Rhythm:Regular Rate:Normal - Systolic murmurs and - Diastolic murmurs    Neuro/Psych  Headaches, negative psych ROS   GI/Hepatic Neg liver ROS, GERD  ,  Endo/Other  negative endocrine ROSneg diabetes  Renal/GU negative Renal ROS     Musculoskeletal  (+) Arthritis ,   Abdominal (+) + obese,   Peds  Hematology  (+) anemia ,   Anesthesia Other Findings Past Medical History: No date: Chicken pox No date: Degenerative disc disease No date: GERD (gastroesophageal reflux disease) No date: Headache(784.0) No date: Hypertension No date: Migraine   Reproductive/Obstetrics                             Anesthesia Physical Anesthesia Plan  ASA: II  Anesthesia Plan: General   Post-op Pain Management:    Induction: Intravenous  PONV Risk Score and Plan: 2 and Propofol infusion  Airway Management Planned: Natural Airway  Additional Equipment:   Intra-op Plan:   Post-operative Plan:   Informed Consent: I have reviewed the patients History and Physical, chart, labs and discussed the procedure including the risks, benefits and alternatives for the proposed anesthesia with the patient or authorized representative who has indicated his/her understanding and acceptance.   Dental  advisory given  Plan Discussed with: CRNA and Anesthesiologist  Anesthesia Plan Comments:         Anesthesia Quick Evaluation

## 2017-02-08 NOTE — Op Note (Addendum)
[facility] Gastroenterology Patient Name: Cindy Bishop Procedure Date: 02/08/2017 8:52 AM MRN: [id number] Account #: [id number] Date of Birth: [birth year] Admit Type: Ambulatory Age: [age] [facility] END[facility] ROOM 1 Gender: Female Note Status: Finalized Procedure:            Colonoscopy Indications:          Rectal bleeding Providers:            Wyline MoodKiran Kaleya Douse MD, MD Referring MD:         No Local Md, MD (Referring MD) Medicines:            Monitored Anesthesia Care Complications:        No immediate complications. Procedure:            Pre-Anesthesia Assessment:                       - Prior to the procedure, a History and Physical was                        performed, and patient medications, allergies and                        sensitivities were reviewed. The patient's tolerance of                        previous anesthesia was reviewed.                       - The risks and benefits of the procedure and the                        sedation options and risks were discussed with the                        patient. All questions were answered and informed                        consent was obtained.                       - ASA Grade Assessment: II - A patient with mild                        systemic disease.                       After obtaining informed consent, the colonoscope was                        passed under direct vision. Throughout the procedure,                        the patient's blood pressure, pulse, and oxygen                        saturations were monitored continuously. The                        Colonoscope was introduced through the anus with the  intention of advancing to the cecum. The scope was                        advanced to the sigmoid colon before the procedure was                        aborted. Medications were given. The colonoscopy was                        performed with ease. The patient tolerated the                        procedure well. The quality of the bowel preparation                        was poor. Findings:      A large amount of solid stool was found in the sigmoid colon, making       visualization difficult. Impression:           - Preparation of the colon was poor.                       - Stool in the sigmoid colon.                       - No specimens collected. Recommendation:       - Discharge patient to home (with escort).                       - Resume previous diet.                       - Continue present medications.                       - Repeat colonoscopy in 4 weeks because the bowel                        preparation was suboptimal. Procedure Code(s):    --- Professional ---                       [phone], 53, Colonoscopy, flexible; diagnostic, including                        collection of specimen(s) by brushing or washing, when                        performed (separate procedure) Diagnosis Code(s):    --- Professional ---                       K62.5, Hemorrhage of anus and rectum CPT copyright 2016 American Medical Association. All rights reserved. The codes documented in this report are preliminary and upon coder review may  be revised to meet current compliance requirements. Wyline MoodKiran Tahjay Binion, MD Wyline MoodKiran Jacorion Klem MD, MD 02/08/2017 8:59:24 AM This report has been signed electronically. Number of Addenda: 0 Note Initiated On: 02/08/2017 8:52 AM Total Procedure Duration: 0 hours 1 minute 33 seconds       [facility]

## 2017-02-08 NOTE — Transfer of Care (Signed)
Immediate Anesthesia Transfer of Care Note  Patient: Cindy Bishop  Procedure(s) Performed: Procedure(s): COLONOSCOPY WITH PROPOFOL (N/A)  Patient Location: Endoscopy Unit  Anesthesia Type:General  Level of Consciousness: awake and alert   Airway & Oxygen Therapy: Patient Spontanous Breathing and Patient connected to nasal cannula oxygen  Post-op Assessment: Report given to RN and Post -op Vital signs reviewed and stable  Post vital signs: Reviewed and stable  Last Vitals:  Vitals:   02/08/17 0828  BP: (!) 154/68  Pulse: 77  Resp: 18  Temp: 37.1 C    Last Pain:  Vitals:   02/08/17 0828  TempSrc: Tympanic  PainSc: 0-No pain         Complications: No apparent anesthesia complications

## 2017-02-08 NOTE — Op Note (Addendum)
[facility] Gastroenterology Patient Name: Cindy Bishop Procedure Date: 02/08/2017 8:20 AM MRN: [id number] Account #: [id number] Date of Birth: 03/10/1971 Admit Type: Ambulatory Age: 46 Room: [facility] ENDO ROOM 1 Gender: Female Note Status: Finalized THIS EXAM WAS SENT IN ERROR 

## 2017-02-08 NOTE — Anesthesia Post-op Follow-up Note (Signed)
Anesthesia QCDR form completed.        

## 2017-02-09 ENCOUNTER — Encounter: Payer: Self-pay | Admitting: Gastroenterology

## 2017-02-09 ENCOUNTER — Other Ambulatory Visit: Payer: Self-pay

## 2017-03-29 ENCOUNTER — Ambulatory Visit: Payer: No Typology Code available for payment source | Admitting: Anesthesiology

## 2017-03-29 ENCOUNTER — Ambulatory Visit
Admission: RE | Admit: 2017-03-29 | Discharge: 2017-03-29 | Disposition: A | Discharge status: Home / Self Care | Payer: No Typology Code available for payment source | Source: Ambulatory Visit | Attending: Gastroenterology | Admitting: Gastroenterology

## 2017-03-29 ENCOUNTER — Encounter: Payer: Self-pay | Admitting: *Deleted

## 2017-03-29 ENCOUNTER — Encounter
Admission: RE | Disposition: A | Discharge status: Home / Self Care | Payer: Self-pay | Source: Ambulatory Visit | Attending: Gastroenterology

## 2017-03-29 DIAGNOSIS — K640 First degree hemorrhoids: Secondary | ICD-10-CM | POA: Insufficient documentation

## 2017-03-29 DIAGNOSIS — Z79899 Other long term (current) drug therapy: Secondary | ICD-10-CM | POA: Insufficient documentation

## 2017-03-29 DIAGNOSIS — I10 Essential (primary) hypertension: Secondary | ICD-10-CM | POA: Diagnosis not present

## 2017-03-29 DIAGNOSIS — K625 Hemorrhage of anus and rectum: Secondary | ICD-10-CM | POA: Diagnosis present

## 2017-03-29 DIAGNOSIS — K219 Gastro-esophageal reflux disease without esophagitis: Secondary | ICD-10-CM | POA: Diagnosis not present

## 2017-03-29 HISTORY — PX: COLONOSCOPY WITH PROPOFOL: SHX5780

## 2017-03-29 LAB — POCT PREGNANCY, URINE: Preg Test, Ur: NEGATIVE

## 2017-03-29 SURGERY — COLONOSCOPY WITH PROPOFOL
Anesthesia: General

## 2017-03-29 MED ORDER — SODIUM CHLORIDE 0.9 % IV SOLN
INTRAVENOUS | Status: DC
  Administered 2017-03-29: 1000 mL via INTRAVENOUS

## 2017-03-29 MED ORDER — LIDOCAINE HCL (PF) 2 % IJ SOLN
INTRAMUSCULAR | Status: AC
  Filled 2017-03-29: qty 2

## 2017-03-29 MED ORDER — PROPOFOL 10 MG/ML IV BOLUS
INTRAVENOUS | Status: DC | PRN
  Administered 2017-03-29: 30 mg via INTRAVENOUS

## 2017-03-29 MED ORDER — PROPOFOL 500 MG/50ML IV EMUL
INTRAVENOUS | Status: DC | PRN
  Administered 2017-03-29: 140 ug/kg/min via INTRAVENOUS

## 2017-03-29 MED ORDER — PROPOFOL 10 MG/ML IV BOLUS
INTRAVENOUS | Status: AC
  Filled 2017-03-29: qty 20

## 2017-03-29 NOTE — Anesthesia Post-op Follow-up Note (Signed)
Anesthesia QCDR form completed.        

## 2017-03-29 NOTE — Anesthesia Postprocedure Evaluation (Signed)
Anesthesia Post Note  Patient: Cindy Bishop  Procedure(s) Performed: Procedure(s) (LRB): COLONOSCOPY WITH PROPOFOL (N/A)  Patient location during evaluation: PACU Anesthesia Type: General Level of consciousness: awake Pain management: pain level controlled Vital Signs Assessment: post-procedure vital signs reviewed and stable Respiratory status: spontaneous breathing Cardiovascular status: stable Anesthetic complications: no     Last Vitals:  Vitals:   03/29/17 0916 03/29/17 0926  BP: 125/76 (!) 141/80  Pulse: (!) 59 65  Resp: 16 16  Temp:    SpO2: 100% 99%    Last Pain:  Vitals:   03/29/17 0856  TempSrc: Tympanic                 VAN STAVEREN,Jennice Renegar

## 2017-03-29 NOTE — Anesthesia Preprocedure Evaluation (Signed)
Anesthesia Evaluation  Patient identified by MRN, date of birth, ID band Patient awake    Reviewed: Allergy & Precautions, NPO status , Patient's Chart, lab work & pertinent test results  Airway Mallampati: II       Dental  (+) Teeth Intact   Pulmonary neg pulmonary ROS,    breath sounds clear to auscultation       Cardiovascular hypertension, Pt. on medications + Peripheral Vascular Disease   Rhythm:Regular     Neuro/Psych  Headaches,    GI/Hepatic Neg liver ROS, GERD  Medicated,  Endo/Other  negative endocrine ROS  Renal/GU negative Renal ROS  negative genitourinary   Musculoskeletal   Abdominal Normal abdominal exam  (+)   Peds negative pediatric ROS (+)  Hematology  (+) anemia ,   Anesthesia Other Findings   Reproductive/Obstetrics                             Anesthesia Physical Anesthesia Plan  ASA: II  Anesthesia Plan: General   Post-op Pain Management:    Induction: Intravenous  PONV Risk Score and Plan: 0  Airway Management Planned: Natural Airway and Nasal Cannula  Additional Equipment:   Intra-op Plan:   Post-operative Plan:   Informed Consent: I have reviewed the patients History and Physical, chart, labs and discussed the procedure including the risks, benefits and alternatives for the proposed anesthesia with the patient or authorized representative who has indicated his/her understanding and acceptance.     Plan Discussed with: Surgeon  Anesthesia Plan Comments:         Anesthesia Quick Evaluation

## 2017-03-29 NOTE — H&P (Signed)
  Wyline Mood MD [address], [address] [place], [place] [address] Phone: [phone] Fax : [phone]  Primary Care Physician:  Patient, No Pcp Per Primary Gastroenterologist:  Dr. Wyline Mood   Pre-Procedure History & Physical: HPI:  Cindy Bishop is a 46 y.o. female is here for an colonoscopy.   Past Medical History:  Diagnosis Date  . Chicken pox   . Degenerative disc disease   . GERD (gastroesophageal reflux disease)   . Headache(784.0)   . Hypertension   . Migraine     Past Surgical History:  Procedure Laterality Date  . COLONOSCOPY WITH PROPOFOL N/A 02/08/2017   Procedure: COLONOSCOPY WITH PROPOFOL;  Surgeon: Wyline Mood, MD;  Location: [facility] ENDOSCOPY;  Service: Endoscopy;  Laterality: N/A;  . WRIST SURGERY  2000   right wrist, at [facility]    Prior to Admission medications   Medication Sig Start Date End Date Taking? Authorizing Provider  hydrocortisone (ANUSOL-HC) 25 MG suppository Place 1 suppository (25 mg total) rectally 2 (two) times daily. Patient not taking: Reported on 01/18/2017 12/03/16   Chrismon, Jodell Cipro, PA  lisinopril-hydrochlorothiazide (PRINZIDE,ZESTORETIC) 10-12.5 MG tablet TAKE ONE TABLET BY MOUTH ONCE DAILY 11/04/16   Chrismon, Jodell Cipro, PA    Allergies as of 02/08/2017  . (No Known Allergies)    Family History  Problem Relation Age of Onset  . Diabetes Mother   . Hypertension Mother   . Hypertension Brother   . Heart disease Maternal Grandmother 46  . Healthy Father   . Lung cancer Father   . Breast cancer Cousin     Social History   Social History  . Marital status: Divorced    Spouse name: N/A  . Number of children: N/A  . Years of education: N/A   Occupational History  . Not on file.   Social History Main Topics  . Smoking status: Never Smoker  . Smokeless tobacco: Never Used  . Alcohol use Yes     Comment: socially  . Drug use: Yes    Types: Marijuana     Comment: yearly  . Sexual activity: Not on file   Other  Topics Concern  . Not on file   Social History Narrative   Lives in [place]. No pets      Work - Doctor, general practice   Diet - regular   Exercise - none    Review of Systems: See HPI, otherwise negative ROS  Physical Exam: BP (!) 143/85   Pulse 80   Temp 97.8 F (36.6 C) (Tympanic)   Resp 18   Ht  (1.702 m)   Wt 204 lb (92.5 kg)   SpO2 97%   BMI 31.95 kg/m  General:   Alert,  pleasant and cooperative in NAD Head:  Normocephalic and atraumatic. Neck:  Supple; no masses or thyromegaly. Lungs:  Clear throughout to auscultation.    Heart:  Regular rate and rhythm. Abdomen:  Soft, nontender and nondistended. Normal bowel sounds, without guarding, and without rebound.   Neurologic:  Alert and  oriented x4;  grossly normal neurologically.  Impression/Plan: Cindy Bishop is here for an colonoscopy to be performed for rectal bleeding   Risks, benefits, limitations, and alternatives regarding  colonoscopy have been reviewed with the patient.  Questions have been answered.  All parties agreeable.   Wyline Mood, MD  03/29/2017, 8:29 AM

## 2017-03-29 NOTE — Transfer of Care (Signed)
Immediate Anesthesia Transfer of Care Note  Patient: Cindy Bishop  Procedure(s) Performed: Procedure(s): COLONOSCOPY WITH PROPOFOL (N/A)  Patient Location: PACU  Anesthesia Type:General  Level of Consciousness: awake  Airway & Oxygen Therapy: Patient Spontanous Breathing  Post-op Assessment: Report given to RN  Post vital signs: Reviewed  Last Vitals:  Vitals:   03/29/17 0758  BP: (!) 143/85  Pulse: 80  Resp: 18  Temp: 36.6 C  SpO2: 97%    Last Pain:  Vitals:   03/29/17 0758  TempSrc: Tympanic         Complications: No apparent anesthesia complications

## 2017-03-29 NOTE — Op Note (Signed)
[facility] Gastroenterology Patient Name: Cindy Bishop Procedure Date: 03/29/2017 8:29 AM MRN: [id number] Account #: [id number] Date of Birth: 05/25/1971 Admit Type: Outpatient Age: 46 Room: [facility] ENDO ROOM 1 Gender: Female Note Status: Finalized Procedure:            Colonoscopy Indications:          Rectal bleeding Providers:            Wyline Mood MD, MD Referring MD:         No Local Md, MD (Referring MD) Medicines:            Monitored Anesthesia Care Complications:        No immediate complications. Procedure:            Pre-Anesthesia Assessment:                       - Prior to the procedure, a History and Physical was                        performed, and patient medications, allergies and                        sensitivities were reviewed. The patient's tolerance of                        previous anesthesia was reviewed.                       - The risks and benefits of the procedure and the                        sedation options and risks were discussed with the                        patient. All questions were answered and informed                        consent was obtained.                       - ASA Grade Assessment: II - A patient with mild                        systemic disease.                       After obtaining informed consent, the colonoscope was                        passed under direct vision. Throughout the procedure,                        the patient's blood pressure, pulse, and oxygen                        saturations were monitored continuously. The                        Colonoscope was introduced through the anus and                        advanced  to the the cecum, identified by the                        appendiceal orifice, IC valve and transillumination.                        The colonoscopy was performed with ease. The patient                        tolerated the procedure well. The quality of the bowel                   preparation was good. Findings:      The perianal and digital rectal examinations were normal.      Non-bleeding internal hemorrhoids were found during endoscopy. The       hemorrhoids were medium-sized and Grade I (internal hemorrhoids that do       not prolapse).      The exam was otherwise without abnormality. Impression:           - Non-bleeding internal hemorrhoids.                       - The examination was otherwise normal.                       - No specimens collected. Recommendation:       - Discharge patient to home (with escort).                       - Resume previous diet.                       - Continue present medications.                       - Repeat colonoscopy in 10 years for screening purposes. Procedure Code(s):    --- Professional ---                       [id number], Colonoscopy, flexible; diagnostic, including                        collection of specimen(s) by brushing or washing, when                        performed (separate procedure) Diagnosis Code(s):    --- Professional ---                       K64.0, First degree hemorrhoids                       K62.5, Hemorrhage of anus and rectum CPT copyright 2016 American Medical Association. All rights reserved. The codes documented in this report are preliminary and upon coder review may  be revised to meet current compliance requirements. Wyline Mood, MD Wyline Mood MD, MD 03/29/2017 8:52:55 AM This report has been signed electronically. Number of Addenda: 0 Note Initiated On: 03/29/2017 8:29 AM Scope Withdrawal Time: 0 hours 12 minutes 31 seconds  Total Procedure Duration: 0 hours 16 minutes 41 seconds       [facility]

## 2017-03-30 ENCOUNTER — Encounter: Payer: Self-pay | Admitting: Gastroenterology

## 2017-05-17 ENCOUNTER — Other Ambulatory Visit: Payer: Self-pay | Admitting: Family Medicine

## 2017-05-17 DIAGNOSIS — Z1231 Encounter for screening mammogram for malignant neoplasm of breast: Secondary | ICD-10-CM

## 2017-05-20 ENCOUNTER — Ambulatory Visit (INDEPENDENT_AMBULATORY_CARE_PROVIDER_SITE_OTHER): Payer: No Typology Code available for payment source | Admitting: Family Medicine

## 2017-05-20 ENCOUNTER — Encounter: Payer: Self-pay | Admitting: Family Medicine

## 2017-05-20 VITALS — BP 128/66 | HR 62 | Temp 98.2°F | Wt 209.0 lb

## 2017-05-20 DIAGNOSIS — M545 Low back pain: Secondary | ICD-10-CM

## 2017-05-20 DIAGNOSIS — G8929 Other chronic pain: Secondary | ICD-10-CM

## 2017-05-20 MED ORDER — NAPROXEN 500 MG PO TABS: 500.0000 mg | ORAL_TABLET | Freq: Two times a day (BID) | ORAL | 1 refills | Status: AC

## 2017-05-20 MED ORDER — METHOCARBAMOL 500 MG PO TABS: 500.0000 mg | ORAL_TABLET | Freq: Four times a day (QID) | ORAL | 1 refills | Status: DC

## 2017-05-20 NOTE — Progress Notes (Signed)
Patient: Cindy SeveranceDarlene Bishop Female    DOB: [birth year]   46 y.o.   MRN: [id number] Visit Date: 05/20/2017  Today's Provider: Dortha Kernennis Clydette Privitera, PA   Chief Complaint  Patient presents with  . Back Pain   Subjective:    Back Pain  This is a new problem. Episode onset: 6-7 months ago. The problem occurs constantly. The problem is unchanged. The pain is present in the lumbar spine. The quality of the pain is described as aching. The pain does not radiate. The pain is worse during the night. The symptoms are aggravated by lying down. Treatments tried: injection   Patient had a lumbar x-ray previously by Dr. [name] [facility]) that showed diffuse multilevel degenerative changes. Had sharp pain in left lower back with sciatica in July 2018 and she went to [facility]. Was given an IM Solu-Medrol injection in the left hip with Norflex. Pain is constant aching now without radiation to the left leg. Feels this is totally different from past episodes.  Past Medical History:  Diagnosis Date  . Chicken pox   . Degenerative disc disease   . GERD (gastroesophageal reflux disease)   . Headache(784.0)   . Hypertension   . Migraine    Past Surgical History:  Procedure Laterality Date  . WRIST SURGERY  2000   [facility]   Family History  Problem Relation Age of Onset  . Diabetes Mother   . Hypertension Mother   . Hypertension Brother   . Heart disease Maternal Grandmother 3469  . Healthy Father   . Lung cancer Father   . Breast cancer Cousin    No Known Allergies  Current Outpatient Medications:  .  lisinopril-hydrochlorothiazide (PRINZIDE,ZESTORETIC) 10-12.5 MG tablet, TAKE ONE TABLET BY MOUTH ONCE DAILY, Disp: 30 tablet, Rfl: 11  Review of Systems  Constitutional: Negative.   Respiratory: Negative.   Cardiovascular: Negative.   Musculoskeletal: Positive for back pain.   Social History   Tobacco Use  . Smoking status: Never Smoker  . Smokeless tobacco: Never Used    Substance Use Topics  . Alcohol use: Yes    Comment: socially   Objective:   BP 128/66 (BP Location: Right Arm, Patient Position: Sitting, Cuff Size: Normal)   Pulse 62   Temp 98.2 F (36.8 C) (Oral)   Wt 209 lb (94.8 kg)   SpO2 99%   BMI 32.73 kg/m  LMP 05-08-17  Physical Exam  Constitutional: She is oriented to person, place, and time. She appears well-developed and well-nourished. No distress.  HENT:  Head: Normocephalic and atraumatic.  Right Ear: Hearing normal.  Left Ear: Hearing normal.  Nose: Nose normal.  Eyes: Conjunctivae and lids are normal. Right eye exhibits no discharge. Left eye exhibits no discharge. No scleral icterus.  Cardiovascular: Normal rate and regular rhythm.  Pulmonary/Chest: Effort normal. No respiratory distress.  Abdominal: Soft. Bowel sounds are normal.  Musculoskeletal: Normal range of motion.  Left low back pain without radiation. Fair ROM of spine without acute pain. SLR's 90 degrees without pain.  Neurological: She is alert and oriented to person, place, and time. She has normal reflexes.  Skin: Skin is intact. No lesion and no rash noted.  Psychiatric: She has a normal mood and affect. Her speech is normal and behavior is normal. Thought content normal.     Assessment & Plan:     1. Chronic left-sided low back pain without sciatica Onset over the past 6-7 months. Persistent aching pain without radiation  in the left lower back. Has a history of DDD of neck and spine per past x-ray reports from Dr. [name] [facility]). Went to [facility] July 2018 with some sciatica and given steroid IM injection (no x-ray evaluation at that visit). Sciatica resolved but chronic pain persists. Will get MRI to rule out stenosis or HNP (although no radicular pain now). Treat with moist heat, Robaxin and Naproxen. Requests set up for orthopedic referral to be ready after getting MRI. Recheck pending reports. - methocarbamol (ROBAXIN) 500 MG tablet; Take 1  tablet (500 mg total) 4 (four) times daily by mouth.  Dispense: 40 tablet; Refill: 1 - naproxen (NAPROSYN) 500 MG tablet; Take 1 tablet (500 mg total) 2 (two) times daily with a meal by mouth.  Dispense: 30 tablet; Refill: 1 - Ambulatory referral to Orthopedic Surgery - MR Lumbar Spine Wo Contrast       Dortha Kernennis Rocko Fesperman, PA  [facility]

## 2017-05-30 ENCOUNTER — Ambulatory Visit
Admission: RE | Admit: 2017-05-30 | Discharge: 2017-05-30 | Disposition: A | Discharge status: Home / Self Care | Payer: No Typology Code available for payment source | Source: Ambulatory Visit | Attending: Family Medicine | Admitting: Family Medicine

## 2017-05-30 DIAGNOSIS — M5126 Other intervertebral disc displacement, lumbar region: Secondary | ICD-10-CM | POA: Insufficient documentation

## 2017-05-30 DIAGNOSIS — G8929 Other chronic pain: Secondary | ICD-10-CM | POA: Diagnosis present

## 2017-05-30 DIAGNOSIS — M47816 Spondylosis without myelopathy or radiculopathy, lumbar region: Secondary | ICD-10-CM | POA: Insufficient documentation

## 2017-05-30 DIAGNOSIS — M545 Low back pain: Secondary | ICD-10-CM | POA: Diagnosis present

## 2017-05-31 ENCOUNTER — Telehealth: Payer: Self-pay

## 2017-05-31 NOTE — Telephone Encounter (Signed)
-----   Message from Tamsen Roersennis E Chrismon, [place] sent at 05/31/2017  7:39 AM EST ----- MRI shows mild degenerative changes without disc bulges impinging on nerves. Medication should help relieve the inflammation. If no better, will schedule referral to a neurosurgeon for possible cortisone injections.

## 2017-05-31 NOTE — Telephone Encounter (Signed)
Attempted to contact patient. No answer and unable to leave a message due to voicemail not being setup.

## 2017-06-07 ENCOUNTER — Ambulatory Visit
Admission: RE | Admit: 2017-06-07 | Discharge: 2017-06-07 | Disposition: A | Discharge status: Home / Self Care | Payer: PRIVATE HEALTH INSURANCE | Source: Ambulatory Visit | Attending: Family Medicine | Admitting: Family Medicine

## 2017-06-07 DIAGNOSIS — Z1231 Encounter for screening mammogram for malignant neoplasm of breast: Secondary | ICD-10-CM | POA: Insufficient documentation

## 2017-06-08 NOTE — Telephone Encounter (Signed)
Patient was advised. KW 

## 2017-06-08 NOTE — Telephone Encounter (Signed)
-----   Message from Tamsen Roersennis E Chrismon, [place] sent at 06/07/2017  5:25 PM EST ----- Normal mammograms without evidence of malignancy. Radiologist recommended repeat mammograms in a year.

## 2017-12-28 ENCOUNTER — Other Ambulatory Visit: Payer: Self-pay | Admitting: Family Medicine

## 2018-04-08 ENCOUNTER — Other Ambulatory Visit: Payer: Self-pay | Admitting: Family Medicine

## 2018-08-21 ENCOUNTER — Other Ambulatory Visit: Payer: Self-pay | Admitting: Family Medicine

## 2018-08-21 DIAGNOSIS — Z1231 Encounter for screening mammogram for malignant neoplasm of breast: Secondary | ICD-10-CM

## 2018-08-28 ENCOUNTER — Ambulatory Visit: Payer: No Typology Code available for payment source | Admitting: Family Medicine

## 2018-08-28 NOTE — Progress Notes (Deleted)
       Patient: Cindy Bishop Female    DOB: 1971-01-28   48 y.o.   MRN: [id number] Visit Date: 08/28/2018  Today's Provider: Dortha Kern, PA   No chief complaint on file.  Subjective:     HPI    Hypertension, follow-up:  BP Readings from Last 3 Encounters:  05/20/17 128/66  03/29/17 (!) 141/80  02/08/17 119/67    She was last seen for hypertension 12/03/2016.  BP at that visit was 122/72. Management since that visit includes; no changes. Current treatment Zestoretic 10-12.5 mg qd.She reports {excellent/good/fair/poor:19665} compliance with treatment. She {ACTION; IS/IS [id number]} having side effects. *** She {is/is not:9024} exercising. She {is/is not:9024} adherent to low salt diet.   Outside blood pressures are ***. She is experiencing {Symptoms; cardiac:12860}.  Patient denies {Symptoms; cardiac:12860}.   Cardiovascular risk factors include {cv risk factors:510}.  Use of agents associated with hypertension: {bp agents assoc with hypertension:511::"none"}.   ------------------------------------------------------------------------    No Known Allergies   Current Outpatient Medications:  .  lisinopril-hydrochlorothiazide (PRINZIDE,ZESTORETIC) 10-12.5 MG tablet, TAKE 1 TABLET BY MOUTH ONCE DAILY *DUE FOR FOLLOW UP APPOINTMENT BEFORE FURTHER REFILLES NEEDED., Disp: 30 tablet, Rfl: 1 .  methocarbamol (ROBAXIN) 500 MG tablet, Take 1 tablet (500 mg total) 4 (four) times daily by mouth., Disp: 40 tablet, Rfl: 1 .  naproxen (NAPROSYN) 500 MG tablet, Take 1 tablet (500 mg total) 2 (two) times daily with a meal by mouth., Disp: 30 tablet, Rfl: 1  Review of Systems  Constitutional: Negative for appetite change, chills, fatigue and fever.  Respiratory: Negative for chest tightness and shortness of breath.   Cardiovascular: Negative for chest pain and palpitations.  Gastrointestinal: Negative for abdominal pain, nausea and vomiting.  Neurological: Negative for  dizziness and weakness.    Social History   Tobacco Use  . Smoking status: Never Smoker  . Smokeless tobacco: Never Used  Substance Use Topics  . Alcohol use: Yes    Comment: socially      Objective:   There were no vitals taken for this visit. There were no vitals filed for this visit.   Physical Exam      Assessment & Plan        Dortha Kern, PA  [facility]

## 2018-08-28 NOTE — Progress Notes (Signed)
Patient: Cindy Bishop Female    DOB: 06/26/1971   48 y.o.   MRN: [id number] Visit Date: 08/29/2018  Today's Provider: Dortha Kern, PA   Chief Complaint  Patient presents with  . Follow-up  . Hypertension   Subjective:     HPI    Hypertension, follow-up:  BP Readings from Last 3 Encounters:  08/29/18 110/70  05/20/17 128/66  03/29/17 (!) 141/80    She was last seen for hypertension 12/03/2016.  BP at that visit was 122/72. Management since that visit includes. No changes, currently taking Zestoretic 10-12.5 mg qd.She reports good compliance with treatment. She is not having side effects.  She is not exercising. She is adherent to low salt diet.   Outside blood pressures are not taken. She is experiencing none.  Patient denies chest pain.   Cardiovascular risk factors include hypertension.  Use of agents associated with hypertension: none.  Family history positive for hypertension in mother and brother. ---------------------------------------------------------------  Past Medical History:  Diagnosis Date  . Chicken pox   . Degenerative disc disease   . GERD (gastroesophageal reflux disease)   . Headache(784.0)   . Hypertension   . Migraine    Past Surgical History:  Procedure Laterality Date  . COLONOSCOPY WITH PROPOFOL N/A 02/08/2017   Procedure: COLONOSCOPY WITH PROPOFOL;  Surgeon: Wyline Mood, MD;  Location: [facility] ENDOSCOPY;  Service: Endoscopy;  Laterality: N/A;  . COLONOSCOPY WITH PROPOFOL N/A 03/29/2017   Procedure: COLONOSCOPY WITH PROPOFOL;  Surgeon: Wyline Mood, MD;  Location[facility] ENDOSCOPY;  Service: Gastroenterology;  Laterality: N/A;  . WRIST SURGERY  2000   right wrist, at [facility]   Family History  Problem Relation Age of Onset  . Diabetes Mother   . Hypertension Mother   . Hypertension Brother   . Heart disease Maternal Grandmother 14  . Healthy Father   . Lung cancer Father   . Breast cancer Cousin    No Known  Allergies  Current Outpatient Medications:  .  lisinopril-hydrochlorothiazide (PRINZIDE,ZESTORETIC) 10-12.5 MG tablet, TAKE 1 TABLET BY MOUTH ONCE DAILY *DUE FOR FOLLOW UP APPOINTMENT BEFORE FURTHER REFILLES NEEDED., Disp: 30 tablet, Rfl: 1 .  methocarbamol (ROBAXIN) 500 MG tablet, Take 1 tablet (500 mg total) 4 (four) times daily by mouth., Disp: 40 tablet, Rfl: 1 .  naproxen (NAPROSYN) 500 MG tablet, Take 1 tablet (500 mg total) 2 (two) times daily with a meal by mouth., Disp: 30 tablet, Rfl: 1  Review of Systems  Constitutional: Negative for appetite change, chills, fatigue and fever.  Respiratory: Negative for chest tightness and shortness of breath.   Cardiovascular: Negative for chest pain and palpitations.  Gastrointestinal: Negative for abdominal pain, nausea and vomiting.  Neurological: Negative for dizziness and weakness.   Social History   Tobacco Use  . Smoking status: Never Smoker  . Smokeless tobacco: Never Used  Substance Use Topics  . Alcohol use: Yes    Comment: socially     Objective:   BP 110/70 (BP Location: Right Arm, Patient Position: Sitting, Cuff Size: Large)   Pulse 76   Temp 98 F (36.7 C) (Oral)   Resp 16   Wt 209 lb (94.8 kg)   SpO2 99%   BMI 32.73 kg/m  LMP 45 days ago - has been irregular Vitals:   08/29/18 1501  BP: 110/70  Pulse: 76  Resp: 16  Temp: 98 F (36.7 C)  TempSrc: Oral  SpO2: 99%  Weight: 209 lb (94.8 kg)  Physical Exam Constitutional:      General: She is not in acute distress.    Appearance: She is well-developed.  HENT:     Head: Normocephalic and atraumatic.     Right Ear: Hearing and tympanic membrane normal.     Left Ear: Hearing and tympanic membrane normal.     Nose: Nose normal.     Mouth/Throat:     Pharynx: Oropharynx is clear.  Eyes:     General: Lids are normal. No scleral icterus.       Right eye: No discharge.        Left eye: No discharge.     Conjunctiva/sclera: Conjunctivae normal.  Neck:      Musculoskeletal: Normal range of motion and neck supple.  Cardiovascular:     Rate and Rhythm: Normal rate and regular rhythm.     Heart sounds: Normal heart sounds.  Pulmonary:     Effort: Pulmonary effort is normal. No respiratory distress.  Abdominal:     General: Bowel sounds are normal.  Musculoskeletal: Normal range of motion.  Skin:    Findings: No lesion or rash.  Neurological:     Mental Status: She is alert and oriented to person, place, and time.  Psychiatric:        Speech: Speech normal.        Behavior: Behavior normal.        Thought Content: Thought content normal.       Assessment & Plan    1. Essential hypertension Well controlled BP and tolerating Prinzide 10-12.5 mg qd. Has had some low potassium in the past but no muscle cramps, weakness or palpitations. Will check routine labs and refill Prinzide. Scheduled for mammogram in the next few days. Recommend she follow up with her GYN for PAP and physical. Recheck pending lab reports. Anticipatory counseling regarding immunizations and general health. - lisinopril-hydrochlorothiazide (PRINZIDE,ZESTORETIC) 10-12.5 MG tablet; TAKE 1 TABLET BY MOUTH ONCE DAILY  Dispense: 90 tablet; Refill: 3 - CBC with Differential/Platelet - Comprehensive metabolic panel - Lipid panel - TSH  2. Viral URI with cough Onset with laryngitis over the past week. No fever, sore throat, headache or body aches. Cough ticklish without sputum production. May use Mucinex-DM and increase fluid intake. Check CBC with diff to rule out bacterial infection or anemia. - dextromethorphan-guaiFENesin (MUCINEX DM) 30-600 MG 12hr tablet; Take 1 tablet by mouth 2 (two) times daily.  Dispense: 20 tablet; Refill: 1 - CBC with Differential/Platelet     Dortha Kern, PA  [facility]

## 2018-08-29 ENCOUNTER — Encounter: Payer: Self-pay | Admitting: Family Medicine

## 2018-08-29 ENCOUNTER — Ambulatory Visit (INDEPENDENT_AMBULATORY_CARE_PROVIDER_SITE_OTHER): Payer: 59 | Admitting: Family Medicine

## 2018-08-29 VITALS — BP 110/70 | HR 76 | Temp 98.0°F | Resp 16 | Wt 209.0 lb

## 2018-08-29 DIAGNOSIS — I10 Essential (primary) hypertension: Secondary | ICD-10-CM | POA: Diagnosis not present

## 2018-08-29 DIAGNOSIS — B9789 Other viral agents as the cause of diseases classified elsewhere: Secondary | ICD-10-CM | POA: Diagnosis not present

## 2018-08-29 DIAGNOSIS — J069 Acute upper respiratory infection, unspecified: Secondary | ICD-10-CM

## 2018-08-29 MED ORDER — LISINOPRIL-HYDROCHLOROTHIAZIDE 10-12.5 MG PO TABS: ORAL_TABLET | ORAL | 3 refills | Status: AC

## 2018-08-29 MED ORDER — DM-GUAIFENESIN ER 30-600 MG PO TB12: 1.0000 | ORAL_TABLET | Freq: Two times a day (BID) | ORAL | 1 refills | Status: DC

## 2018-08-30 LAB — COMPREHENSIVE METABOLIC PANEL
A/G RATIO: 1.5 (ref 1.2–2.2)
ALBUMIN: 4.1 g/dL (ref 3.8–4.8)
ALT: 13 IU/L (ref 0–32)
AST: 19 IU/L (ref 0–40)
Alkaline Phosphatase: 80 IU/L (ref 39–117)
BILIRUBIN TOTAL: 0.4 mg/dL (ref 0.0–1.2)
BUN / CREAT RATIO: 11 (ref 9–23)
BUN: 8 mg/dL (ref 6–24)
CHLORIDE: 100 mmol/L (ref 96–106)
CO2: 24 mmol/L (ref 20–29)
Calcium: 9.3 mg/dL (ref 8.7–10.2)
Creatinine, Ser: 0.7 mg/dL (ref 0.57–1.00)
GFR calc non Af Amer: 102 mL/min/{1.73_m2} (ref >59)
GFR, EST AFRICAN AMERICAN: 118 mL/min/{1.73_m2} (ref >59)
GLOBULIN, TOTAL: 2.8 g/dL (ref 1.5–4.5)
GLUCOSE: 84 mg/dL (ref 65–99)
POTASSIUM: 4.1 mmol/L (ref 3.5–5.2)
SODIUM: 140 mmol/L (ref 134–144)
TOTAL PROTEIN: 6.9 g/dL (ref 6.0–8.5)

## 2018-08-30 LAB — CBC WITH DIFFERENTIAL/PLATELET
BASOS ABS: 0 10*3/uL (ref 0.0–0.2)
Basos: 1 %
EOS (ABSOLUTE): 0.1 10*3/uL (ref 0.0–0.4)
EOS: 1 %
HEMATOCRIT: 40.8 % (ref 34.0–46.6)
HEMOGLOBIN: 13.5 g/dL (ref 11.1–15.9)
IMMATURE GRANS (ABS): 0 10*3/uL (ref 0.0–0.1)
Immature Granulocytes: 0 %
LYMPHS: 34 %
Lymphocytes Absolute: 2 10*3/uL (ref 0.7–3.1)
MCH: 29.3 pg (ref 26.6–33.0)
MCHC: 33.1 g/dL (ref 31.5–35.7)
MCV: 89 fL (ref 79–97)
MONOCYTES: 11 %
Monocytes Absolute: 0.6 10*3/uL (ref 0.1–0.9)
Neutrophils Absolute: 3 10*3/uL (ref 1.4–7.0)
Neutrophils: 53 %
Platelets: 342 10*3/uL (ref 150–450)
RBC: 4.61 x10E6/uL (ref 3.77–5.28)
RDW: 13 % (ref 11.7–15.4)
WBC: 5.7 10*3/uL (ref 3.4–10.8)

## 2018-08-30 LAB — TSH: TSH: 0.639 u[IU]/mL (ref 0.450–4.500)

## 2018-08-30 LAB — LIPID PANEL
CHOLESTEROL TOTAL: 159 mg/dL (ref 100–199)
Chol/HDL Ratio: 3.2 ratio (ref 0.0–4.4)
HDL: 49 mg/dL (ref >39)
LDL Calculated: 84 mg/dL (ref 0–99)
Triglycerides: 132 mg/dL (ref 0–149)
VLDL CHOLESTEROL CAL: 26 mg/dL (ref 5–40)

## 2018-08-31 ENCOUNTER — Ambulatory Visit: Payer: No Typology Code available for payment source | Admitting: Family Medicine

## 2018-08-31 ENCOUNTER — Other Ambulatory Visit: Payer: Self-pay | Admitting: Family Medicine

## 2018-09-04 ENCOUNTER — Ambulatory Visit
Admission: RE | Admit: 2018-09-04 | Discharge: 2018-09-04 | Disposition: A | Discharge status: Home / Self Care | Payer: 59 | Source: Ambulatory Visit | Attending: Family Medicine | Admitting: Family Medicine

## 2018-09-04 DIAGNOSIS — Z1231 Encounter for screening mammogram for malignant neoplasm of breast: Secondary | ICD-10-CM | POA: Diagnosis not present

## 2019-02-20 ENCOUNTER — Other Ambulatory Visit: Payer: Self-pay

## 2019-02-20 DIAGNOSIS — Z20822 Contact with and (suspected) exposure to covid-19: Secondary | ICD-10-CM

## 2019-02-21 LAB — NOVEL CORONAVIRUS, NAA: SARS-CoV-2, NAA: NOT DETECTED

## 2019-02-22 ENCOUNTER — Telehealth: Payer: Self-pay | Admitting: Family Medicine

## 2019-02-22 NOTE — Telephone Encounter (Signed)
Negative COVID results given. Patient results "NOT Detected." Caller expressed understanding. ° °

## 2019-12-25 IMAGING — MG DIGITAL SCREENING BILATERAL MAMMOGRAM WITH TOMO AND CAD
8 series · 8 of 24 positions shown · non-contrast
Comparison: Previous exam(s).

CLINICAL DATA: Screening.

EXAM:
DIGITAL SCREENING BILATERAL MAMMOGRAM WITH TOMO AND CAD

[R MLO synth-2D]
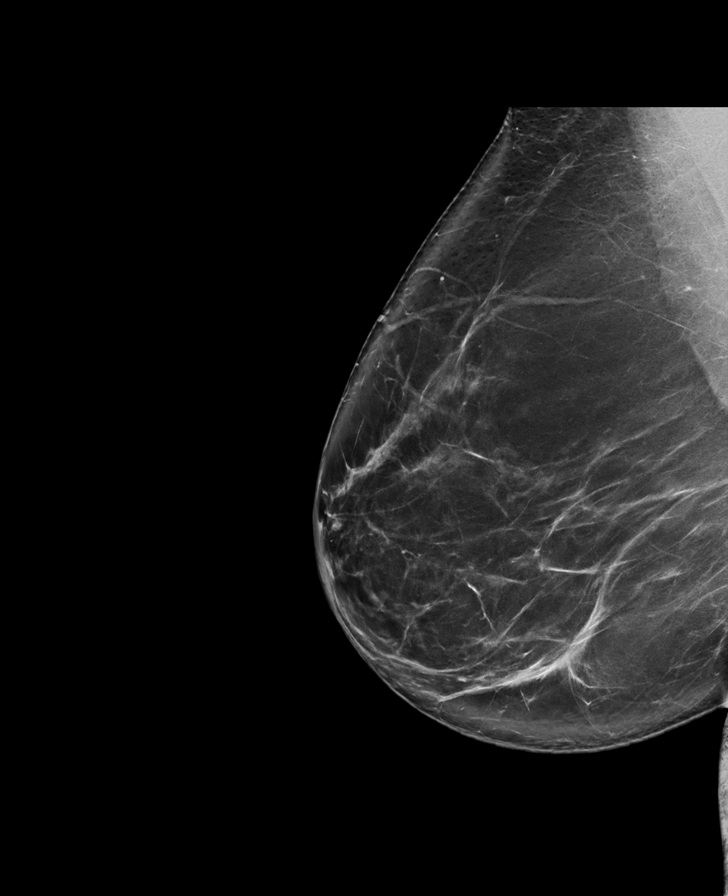

[L MLO synth-2D]
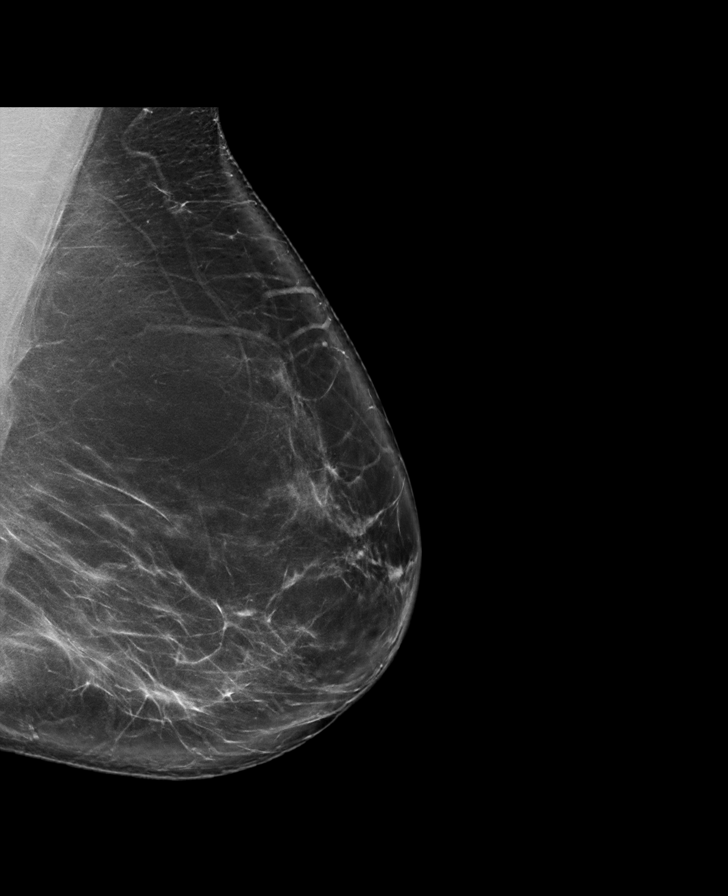

[R CC synth-2D]
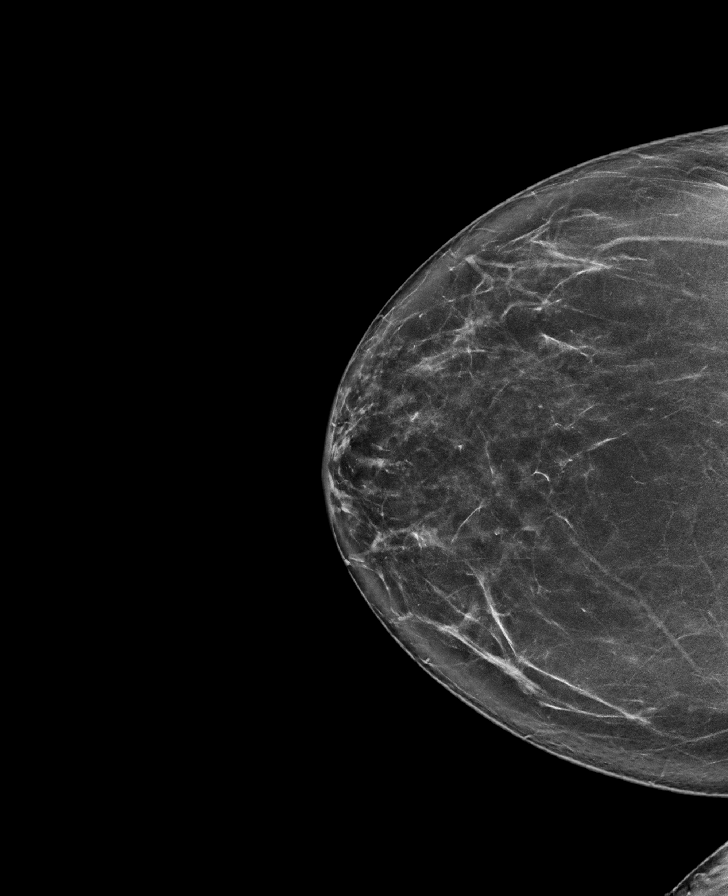

[L CC synth-2D]
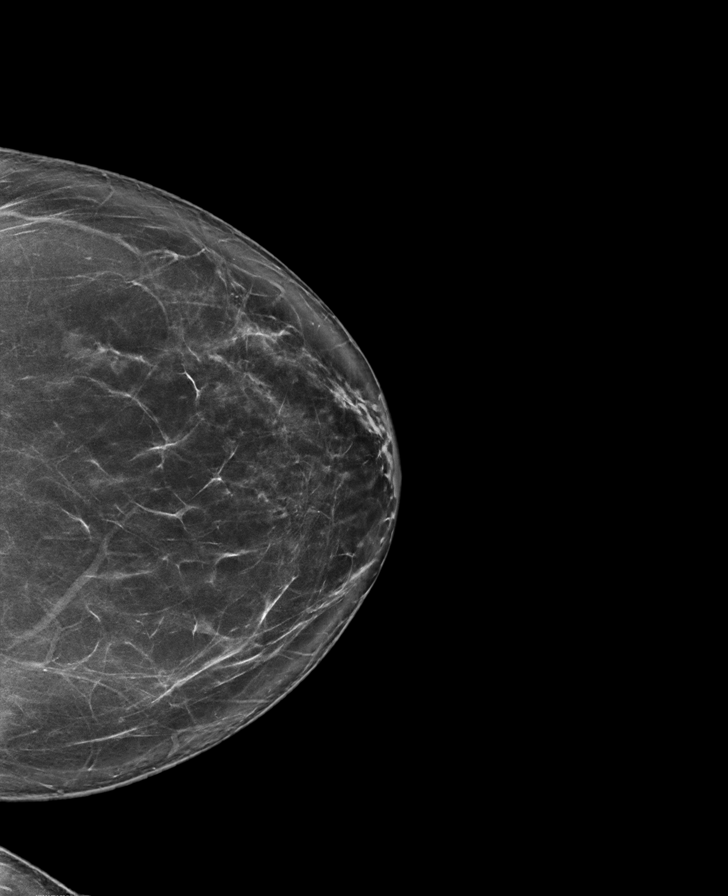

[L CC tomo · tomo slice 45/88.0]
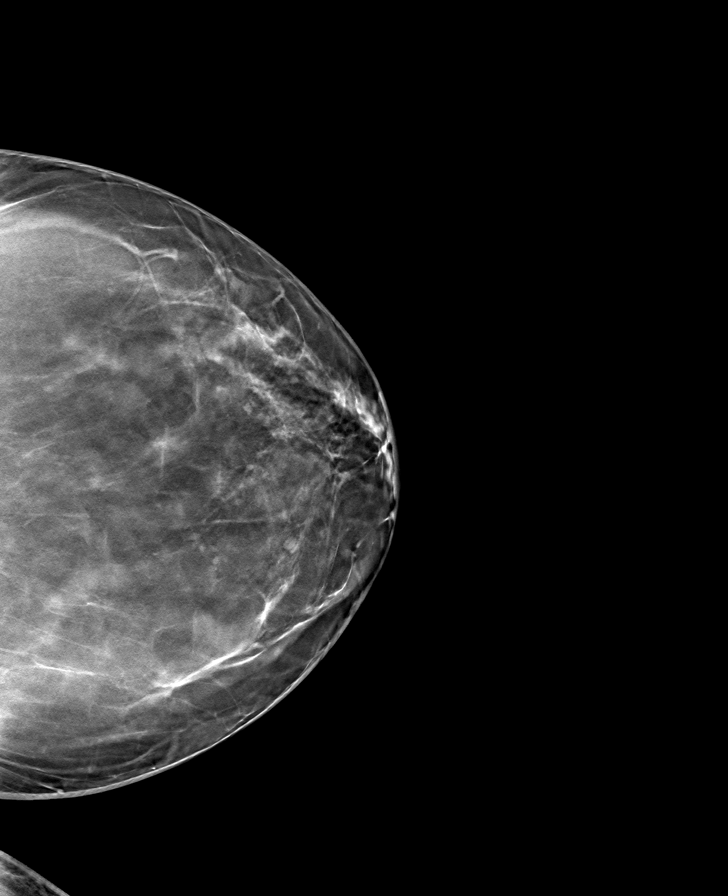

[R CC tomo · tomo slice 43/86.0]
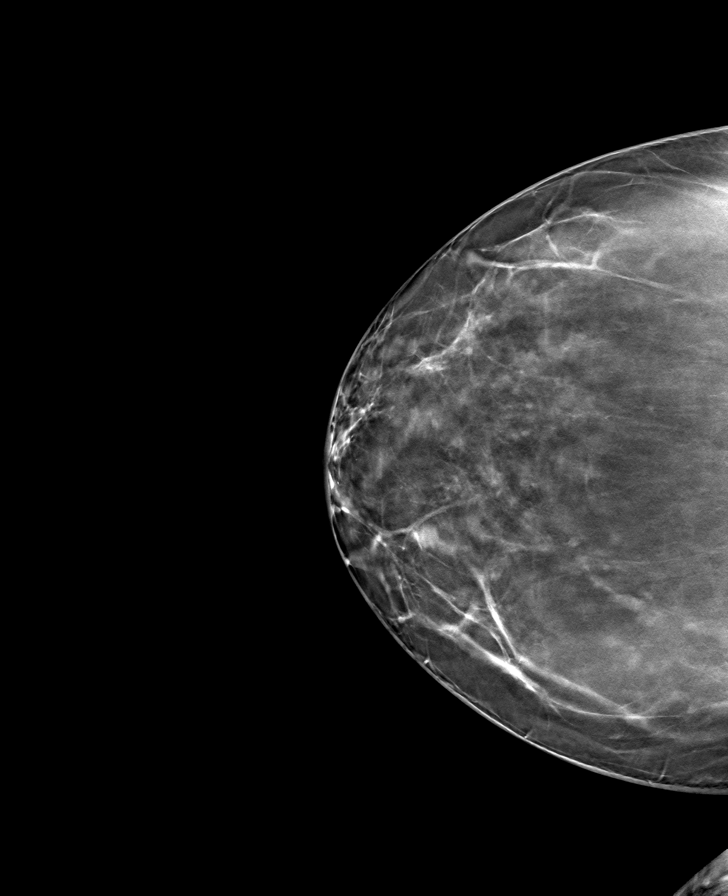

[R MLO tomo · tomo slice 49/98.0]
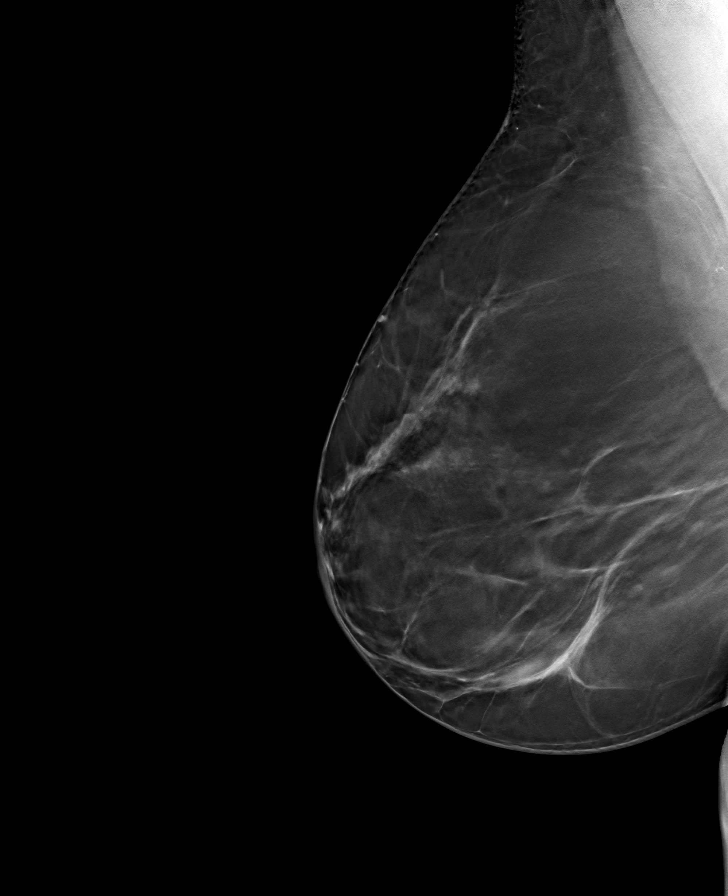

[L MLO tomo · tomo slice 51/101.0]
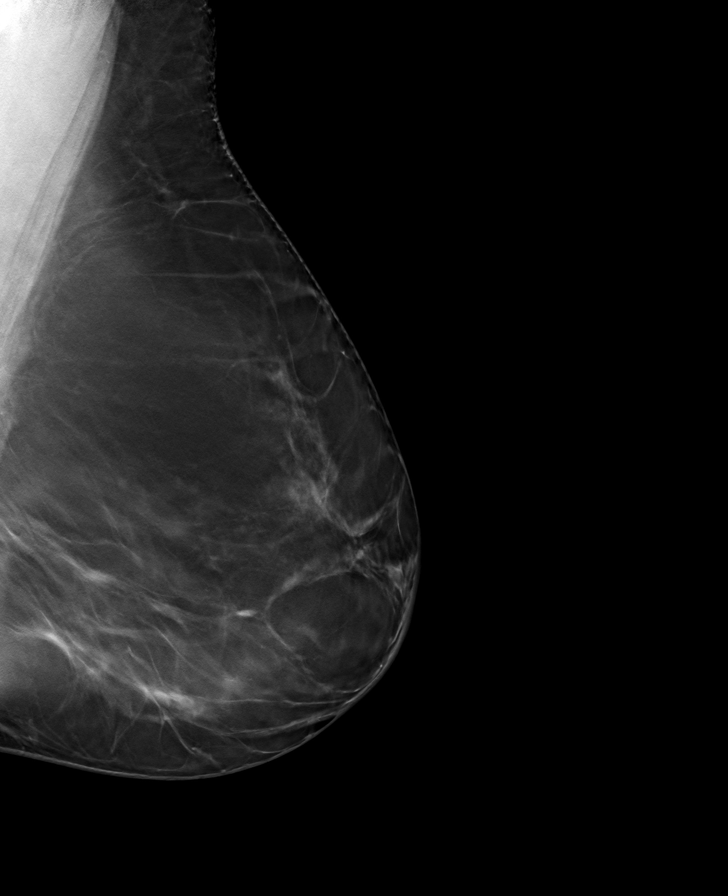

[8 of 24 positions shown; findings below may reference images not displayed]

ACR Breast Density Category b: There are scattered areas of
fibroglandular density.
FINDINGS: There are no findings suspicious for malignancy. Images were
processed with CAD.
IMPRESSION: No mammographic evidence of malignancy. A result letter of this
screening mammogram will be mailed directly to the patient.

RECOMMENDATION:
Screening mammogram in one year. (Code:CN-U-775)

BI-RADS CATEGORY  1: Negative.

## 2020-04-08 ENCOUNTER — Other Ambulatory Visit: Payer: Self-pay | Admitting: Family Medicine

## 2020-05-28 ENCOUNTER — Other Ambulatory Visit: Payer: Self-pay | Admitting: Internal Medicine

## 2020-05-28 DIAGNOSIS — Z1231 Encounter for screening mammogram for malignant neoplasm of breast: Secondary | ICD-10-CM

## 2020-10-20 ENCOUNTER — Other Ambulatory Visit: Payer: Self-pay

## 2020-10-20 ENCOUNTER — Ambulatory Visit
Admission: RE | Admit: 2020-10-20 | Discharge: 2020-10-20 | Disposition: A | Discharge status: Home / Self Care | Payer: Commercial Managed Care - PPO | Source: Ambulatory Visit | Attending: Internal Medicine | Admitting: Internal Medicine

## 2020-10-20 DIAGNOSIS — Z1231 Encounter for screening mammogram for malignant neoplasm of breast: Secondary | ICD-10-CM | POA: Diagnosis not present

## 2021-10-05 ENCOUNTER — Other Ambulatory Visit: Payer: Self-pay | Admitting: Internal Medicine

## 2021-10-05 DIAGNOSIS — Z1231 Encounter for screening mammogram for malignant neoplasm of breast: Secondary | ICD-10-CM

## 2021-11-17 ENCOUNTER — Ambulatory Visit
Admission: RE | Admit: 2021-11-17 | Discharge: 2021-11-17 | Disposition: A | Discharge status: Home / Self Care | Payer: Commercial Managed Care - PPO | Source: Ambulatory Visit | Attending: Internal Medicine | Admitting: Internal Medicine

## 2021-11-17 DIAGNOSIS — Z1231 Encounter for screening mammogram for malignant neoplasm of breast: Secondary | ICD-10-CM | POA: Diagnosis present

## 2022-11-22 ENCOUNTER — Other Ambulatory Visit: Payer: Self-pay | Admitting: Internal Medicine

## 2022-11-22 DIAGNOSIS — Z1231 Encounter for screening mammogram for malignant neoplasm of breast: Secondary | ICD-10-CM

## 2022-12-08 ENCOUNTER — Ambulatory Visit
Admission: RE | Admit: 2022-12-08 | Discharge: 2022-12-08 | Disposition: A | Discharge status: Home / Self Care | Payer: Commercial Managed Care - PPO | Source: Ambulatory Visit | Attending: Internal Medicine | Admitting: Internal Medicine

## 2022-12-08 DIAGNOSIS — Z1231 Encounter for screening mammogram for malignant neoplasm of breast: Secondary | ICD-10-CM | POA: Insufficient documentation

## 2023-11-16 ENCOUNTER — Other Ambulatory Visit: Payer: Self-pay | Admitting: Internal Medicine

## 2023-11-16 DIAGNOSIS — Z1231 Encounter for screening mammogram for malignant neoplasm of breast: Secondary | ICD-10-CM

## 2023-12-14 ENCOUNTER — Ambulatory Visit
Admission: RE | Admit: 2023-12-14 | Discharge: 2023-12-14 | Disposition: A | Discharge status: Home / Self Care | Source: Ambulatory Visit | Attending: Internal Medicine | Admitting: Internal Medicine

## 2023-12-14 ENCOUNTER — Encounter: Payer: Self-pay | Admitting: Radiology

## 2023-12-14 DIAGNOSIS — Z1231 Encounter for screening mammogram for malignant neoplasm of breast: Secondary | ICD-10-CM | POA: Insufficient documentation
